# Patient Record
Sex: Male | Born: 1980 | Race: Black or African American | Hispanic: No | Smoking: Current every day smoker
Health system: Southern US, Community
[De-identification: ages and names within clinical notes are randomized; demographics above are authoritative.]

## PROBLEM LIST (undated history)

## (undated) ENCOUNTER — Emergency Department (HOSPITAL_COMMUNITY): Admission: EM | Payer: No Typology Code available for payment source | Source: Home / Self Care

## (undated) DIAGNOSIS — Z789 Other specified health status: Secondary | ICD-10-CM

## (undated) DIAGNOSIS — M1990 Unspecified osteoarthritis, unspecified site: Secondary | ICD-10-CM

## (undated) DIAGNOSIS — D573 Sickle-cell trait: Secondary | ICD-10-CM

## (undated) DIAGNOSIS — W3400XA Accidental discharge from unspecified firearms or gun, initial encounter: Secondary | ICD-10-CM

## (undated) HISTORY — PX: OTHER SURGICAL HISTORY: SHX169

## (undated) HISTORY — PX: FACIAL LACERATION REPAIR: SHX6589

---

## 2002-08-11 HISTORY — PX: KNEE SURGERY: SHX244

## 2002-08-19 ENCOUNTER — Inpatient Hospital Stay (HOSPITAL_COMMUNITY): Admission: EM | Admit: 2002-08-19 | Discharge: 2002-08-20 | Payer: Self-pay | Admitting: Emergency Medicine

## 2002-08-19 ENCOUNTER — Encounter: Payer: Self-pay | Admitting: Emergency Medicine

## 2005-08-01 ENCOUNTER — Emergency Department (HOSPITAL_COMMUNITY): Admission: EM | Admit: 2005-08-01 | Discharge: 2005-08-01 | Payer: Self-pay | Admitting: Emergency Medicine

## 2006-02-07 ENCOUNTER — Emergency Department (HOSPITAL_COMMUNITY): Admission: EM | Admit: 2006-02-07 | Discharge: 2006-02-07 | Payer: Self-pay | Admitting: Emergency Medicine

## 2006-03-07 ENCOUNTER — Emergency Department (HOSPITAL_COMMUNITY): Admission: EM | Admit: 2006-03-07 | Discharge: 2006-03-07 | Payer: Self-pay | Admitting: Emergency Medicine

## 2006-04-05 ENCOUNTER — Emergency Department (HOSPITAL_COMMUNITY): Admission: EM | Admit: 2006-04-05 | Discharge: 2006-04-05 | Payer: Self-pay | Admitting: Emergency Medicine

## 2006-04-11 ENCOUNTER — Emergency Department (HOSPITAL_COMMUNITY): Admission: EM | Admit: 2006-04-11 | Discharge: 2006-04-11 | Payer: Self-pay | Admitting: Emergency Medicine

## 2006-05-31 ENCOUNTER — Emergency Department (HOSPITAL_COMMUNITY): Admission: EM | Admit: 2006-05-31 | Discharge: 2006-05-31 | Payer: Self-pay | Admitting: Emergency Medicine

## 2006-09-02 ENCOUNTER — Emergency Department (HOSPITAL_COMMUNITY): Admission: EM | Admit: 2006-09-02 | Discharge: 2006-09-02 | Payer: Self-pay | Admitting: Family Medicine

## 2006-09-13 ENCOUNTER — Emergency Department (HOSPITAL_COMMUNITY): Admission: EM | Admit: 2006-09-13 | Discharge: 2006-09-13 | Payer: Self-pay | Admitting: Family Medicine

## 2006-09-19 ENCOUNTER — Emergency Department (HOSPITAL_COMMUNITY): Admission: EM | Admit: 2006-09-19 | Discharge: 2006-09-19 | Payer: Self-pay | Admitting: Family Medicine

## 2007-03-02 ENCOUNTER — Inpatient Hospital Stay (HOSPITAL_COMMUNITY): Admission: EM | Admit: 2007-03-02 | Discharge: 2007-03-03 | Payer: Self-pay | Admitting: Emergency Medicine

## 2007-06-01 ENCOUNTER — Ambulatory Visit: Payer: Self-pay | Admitting: Internal Medicine

## 2007-06-29 ENCOUNTER — Emergency Department (HOSPITAL_COMMUNITY): Admission: EM | Admit: 2007-06-29 | Discharge: 2007-06-29 | Payer: Self-pay | Admitting: Emergency Medicine

## 2008-07-12 ENCOUNTER — Emergency Department (HOSPITAL_COMMUNITY): Admission: EM | Admit: 2008-07-12 | Discharge: 2008-07-12 | Payer: Self-pay | Admitting: Family Medicine

## 2010-01-01 ENCOUNTER — Emergency Department (HOSPITAL_COMMUNITY): Admission: EM | Admit: 2010-01-01 | Discharge: 2010-01-01 | Payer: Self-pay | Admitting: Emergency Medicine

## 2010-01-07 ENCOUNTER — Emergency Department (HOSPITAL_COMMUNITY): Admission: EM | Admit: 2010-01-07 | Discharge: 2010-01-07 | Payer: Self-pay | Admitting: Family Medicine

## 2010-05-10 ENCOUNTER — Emergency Department (HOSPITAL_COMMUNITY): Admission: EM | Admit: 2010-05-10 | Discharge: 2010-05-10 | Payer: Self-pay | Admitting: Family Medicine

## 2010-08-20 ENCOUNTER — Emergency Department (HOSPITAL_COMMUNITY): Admission: EM | Admit: 2010-08-20 | Discharge: 2010-08-20 | Payer: Self-pay | Admitting: Family Medicine
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
## 2011-07-12 LAB — HIV ANTIBODY (ROUTINE TESTING W REFLEX): HIV: NONREACTIVE

## 2011-07-12 LAB — GC/CHLAMYDIA PROBE AMP, GENITAL
Chlamydia, DNA Probe: NEGATIVE
GC Probe Amp, Genital: NEGATIVE

## 2011-07-12 LAB — HERPES SIMPLEX VIRUS CULTURE: Culture: DETECTED

## 2011-07-12 LAB — HEPATITIS B SURFACE ANTIGEN: Hepatitis B Surface Ag: NEGATIVE

## 2011-07-22 LAB — POCT URINALYSIS DIP (DEVICE)
Bilirubin Urine: NEGATIVE
Glucose, UA: NEGATIVE
Hgb urine dipstick: NEGATIVE
Operator id: 235561
Specific Gravity, Urine: 1.015
Urobilinogen, UA: 1
pH: 6.5

## 2011-12-02 ENCOUNTER — Encounter (HOSPITAL_COMMUNITY): Payer: Self-pay

## 2011-12-02 ENCOUNTER — Emergency Department (HOSPITAL_COMMUNITY)
Admission: EM | Admit: 2011-12-02 | Discharge: 2011-12-02 | Disposition: A | Discharge status: Home Health | Payer: Self-pay | Attending: Emergency Medicine | Admitting: Emergency Medicine

## 2011-12-02 DIAGNOSIS — N390 Urinary tract infection, site not specified: Secondary | ICD-10-CM | POA: Insufficient documentation

## 2011-12-02 DIAGNOSIS — N509 Disorder of male genital organs, unspecified: Secondary | ICD-10-CM | POA: Insufficient documentation

## 2011-12-02 LAB — URINALYSIS, ROUTINE W REFLEX MICROSCOPIC
Glucose, UA: NEGATIVE mg/dL
Hgb urine dipstick: NEGATIVE
Protein, ur: NEGATIVE mg/dL

## 2011-12-02 MED ORDER — CEFTRIAXONE SODIUM 250 MG IJ SOLR
250.0000 mg | Freq: Once | INTRAMUSCULAR | Status: AC
  Administered 2011-12-02: 250 mg via INTRAMUSCULAR
  Filled 2011-12-02: qty 250

## 2011-12-02 MED ORDER — CIPROFLOXACIN HCL 500 MG PO TABS: 500.0000 mg | ORAL_TABLET | Freq: Two times a day (BID) | ORAL | Status: AC

## 2011-12-02 MED ORDER — AZITHROMYCIN 250 MG PO TABS
1000.0000 mg | ORAL_TABLET | Freq: Once | ORAL | Status: AC
  Administered 2011-12-02: 1000 mg via ORAL
  Filled 2011-12-02: qty 4

## 2011-12-02 NOTE — Discharge Instructions (Signed)

## 2011-12-02 NOTE — ED Provider Notes (Signed)
History     CSN: [id number]  Arrival date & time 12/02/11  1034   First MD Initiated Contact with Patient 12/02/11 1117      Chief Complaint  Patient presents with  . Penis Pain    (Consider location/radiation/quality/duration/timing/severity/associated sxs/prior treatment) HPI  31 year old healthy male presenting to the ED with chief complaints of penile pain. Pain started last night after the patient had sexual intercourse with his girlfriend. He did not use any protection. Patient state pain is noticed to the tip of his penis. Describe pain as a sharp sensation that waxed and waned. He denies any dysuria, urinary frequency, penile discharge, or rash. He denies abdominal pain, hernia, testicular pain, or swelling. 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 He denies any fever, chills, nausea, vomiting, diarrhea.  History reviewed. No pertinent past medical history.  History reviewed. No pertinent past surgical history.  History reviewed. No pertinent family history.  History  Substance Use Topics  . Smoking status: Current Everyday Smoker -- 0.5 packs/day  . Smokeless tobacco: Not on file  . Alcohol Use: Yes      Review of Systems  All other systems reviewed and are negative.    Allergies  Aspirin  Home Medications  No current outpatient prescriptions on file.  BP 128/68  Pulse 80  Temp(Src) 98.1 F (36.7 C) (Oral)  Resp 20  SpO2 98%  Physical Exam  Nursing note and vitals reviewed. Constitutional: He appears well-developed and well-nourished. No distress.  Eyes: Conjunctivae are normal.  Neck: Neck supple.  Abdominal: Soft. There is no tenderness. No hernia. Hernia confirmed negative in the ventral area, confirmed negative in the right inguinal area and confirmed negative in the left inguinal area.  Genitourinary: Testes normal and penis normal. Cremasteric reflex is present. Right testis shows no mass, no swelling and no tenderness. Right  testis is descended. Cremasteric reflex is not absent on the right side. Left testis shows no mass, no swelling and no tenderness. Left testis is descended. Cremasteric reflex is not absent on the left side. Circumcised. No phimosis, paraphimosis, hypospadias, penile erythema or penile tenderness. No discharge found.  Musculoskeletal: Normal range of motion.  Lymphadenopathy:       Right: No inguinal adenopathy present.       Left: No inguinal adenopathy present.  Neurological: He is alert.  Skin: Skin is warm and dry.  Psychiatric: He has a normal mood and affect.    ED Course  Procedures (including critical care time)  Labs Reviewed - No data to display No results found.   No diagnosis found.    MDM  Patient has pain to the tip of penis after sexual intercourse, unprotected. Examination is unremarkable. Pain is not reproducible. Shaft of penis is normal without pain. No testicular pain or swelling. No hernia noted. Testicle descended bilaterally. I have low suspicions for penile fracture, testicular torsion, epididymitis, or orchitis. A urine sample was obtained, and a GC/Chlamydia culture was also obtained. Patient will also be treated prophylactically with Zithromax and Rocephin. Safe sexual practice discussed. Strict followup instruction given. Patient voiced understanding and agree with plan.  12:01 PM Patient's urine shows evidence of urinary tract infection. Patient will be discharged with antibiotic. Urine culture sent.        Fayrene Helper, PA-C 12/02/11 1202

## 2011-12-02 NOTE — ED Notes (Signed)
Pt states that starting yesterday he developed penile pain that was sharp in nature. He is unsure about any drainage. Denies any scrotal pain.

## 2011-12-02 NOTE — ED Provider Notes (Signed)
Medical screening examination/treatment/procedure(s) were performed by non-physician practitioner and as supervising physician I was immediately available for consultation/collaboration.  Angelyne Terwilliger P Guinevere Stephenson, MD 12/02/11 1612 

## 2011-12-03 LAB — URINE CULTURE
Colony Count: NO GROWTH
Culture  Setup Time: 201302211249
Culture: NO GROWTH

## 2011-12-04 LAB — GC/CHLAMYDIA PROBE AMP, GENITAL: Chlamydia, DNA Probe: POSITIVE — AB

## 2011-12-05 NOTE — ED Notes (Signed)
+  Chlamydia. Patient treated with Rocephin and Zithromax. 

## 2011-12-05 NOTE — ED Notes (Signed)
Patient called back and was notified of + Chlamydia after verifying X 2.

## 2011-12-05 NOTE — ED Notes (Signed)
Attempted to call patient.  No answer.

## 2012-02-16 ENCOUNTER — Encounter (HOSPITAL_COMMUNITY): Payer: Self-pay | Admitting: *Deleted

## 2012-02-16 ENCOUNTER — Emergency Department (HOSPITAL_COMMUNITY): Payer: Self-pay

## 2012-02-16 ENCOUNTER — Emergency Department (HOSPITAL_COMMUNITY)
Admission: EM | Admit: 2012-02-16 | Discharge: 2012-02-16 | Disposition: A | Discharge status: Home / Self Care | Payer: Self-pay | Attending: Emergency Medicine | Admitting: Emergency Medicine

## 2012-02-16 DIAGNOSIS — S0083XA Contusion of other part of head, initial encounter: Secondary | ICD-10-CM

## 2012-02-16 DIAGNOSIS — S0003XA Contusion of scalp, initial encounter: Secondary | ICD-10-CM | POA: Insufficient documentation

## 2012-02-16 DIAGNOSIS — R220 Localized swelling, mass and lump, head: Secondary | ICD-10-CM | POA: Insufficient documentation

## 2012-02-16 MED ORDER — PROPARACAINE HCL 0.5 % OP SOLN
1.0000 [drp] | Freq: Once | OPHTHALMIC | Status: AC
  Administered 2012-02-16: 1 [drp] via OPHTHALMIC
  Filled 2012-02-16 (×2): qty 15
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
## 2012-11-22 ENCOUNTER — Emergency Department (HOSPITAL_COMMUNITY)
Admission: EM | Admit: 2012-11-22 | Discharge: 2012-11-22 | Disposition: A | Discharge status: Home / Self Care | Payer: Self-pay | Attending: Emergency Medicine | Admitting: Emergency Medicine

## 2012-11-22 ENCOUNTER — Emergency Department (HOSPITAL_COMMUNITY): Payer: Self-pay

## 2012-11-22 ENCOUNTER — Encounter (HOSPITAL_COMMUNITY): Payer: Self-pay | Admitting: *Deleted

## 2012-11-22 DIAGNOSIS — R05 Cough: Secondary | ICD-10-CM | POA: Insufficient documentation

## 2012-11-22 DIAGNOSIS — J45909 Unspecified asthma, uncomplicated: Secondary | ICD-10-CM | POA: Insufficient documentation

## 2012-11-22 DIAGNOSIS — J3489 Other specified disorders of nose and nasal sinuses: Secondary | ICD-10-CM | POA: Insufficient documentation

## 2012-11-22 DIAGNOSIS — J209 Acute bronchitis, unspecified: Secondary | ICD-10-CM | POA: Insufficient documentation

## 2012-11-22 DIAGNOSIS — R059 Cough, unspecified: Secondary | ICD-10-CM | POA: Insufficient documentation

## 2012-11-22 DIAGNOSIS — IMO0001 Reserved for inherently not codable concepts without codable children: Secondary | ICD-10-CM | POA: Insufficient documentation

## 2012-11-22 DIAGNOSIS — J40 Bronchitis, not specified as acute or chronic: Secondary | ICD-10-CM

## 2012-11-22 DIAGNOSIS — F17200 Nicotine dependence, unspecified, uncomplicated: Secondary | ICD-10-CM | POA: Insufficient documentation

## 2012-11-22 DIAGNOSIS — R51 Headache: Secondary | ICD-10-CM | POA: Insufficient documentation

## 2012-11-22 DIAGNOSIS — R0982 Postnasal drip: Secondary | ICD-10-CM | POA: Insufficient documentation

## 2012-11-22 MED ORDER — ALBUTEROL SULFATE HFA 108 (90 BASE) MCG/ACT IN AERS
2.0000 | INHALATION_SPRAY | Freq: Once | RESPIRATORY_TRACT | Status: AC
  Administered 2012-11-22: 2 via RESPIRATORY_TRACT
  Filled 2012-11-22: qty 6.7

## 2012-11-22 MED ORDER — AZITHROMYCIN 250 MG PO TABS: 250.0000 mg | ORAL_TABLET | Freq: Every day | ORAL | Status: DC

## 2012-11-22 MED ORDER — AZITHROMYCIN 250 MG PO TABS
500.0000 mg | ORAL_TABLET | Freq: Once | ORAL | Status: AC
  Administered 2012-11-22: 500 mg via ORAL
  Filled 2012-11-22: qty 2

## 2012-11-22 NOTE — ED Provider Notes (Signed)
History     CSN: [id number]  Arrival date & time 11/22/12  1113   First MD Initiated Contact with Patient 11/22/12 1137      Chief Complaint  Patient presents with  . Generalized Body Aches  . Fever    (Consider location/radiation/quality/duration/timing/severity/associated sxs/prior treatment) HPI Comments: 32 y.o. Male presents to the ER with a c/o fever, cough, headache, and body aches x4 days that have been gradually worsening. States cough is productive with green sputum on occasion. He also complains of a achy sore throat that is worsened by swallowing. Admits to associated headache. Fever is subjective since he has not taken his temperature. He try taking Tylenol which he does not feel provided any relief. He complains of an episode of night sweats last night.  Nothing has helped any of his symptoms and nothing makes them worse.  He has been around sick children.  Denies any medical problems and takes no medications on a regular basis. He did not have the flu shot this year.   The history is provided by the patient.    History reviewed. No pertinent past medical history.  History reviewed. No pertinent past surgical history.  No family history on file.  History  Substance Use Topics  . Smoking status: Current Every Day Smoker -- 0.25 packs/day  . Smokeless tobacco: Not on file  . Alcohol Use: Yes     Comment: 1 40 oz per day      Review of Systems  Constitutional: Positive for fever, chills and activity change.  HENT: Positive for congestion and postnasal drip. Negative for neck pain and neck stiffness.   Eyes: Negative.   Respiratory: Positive for cough. Negative for shortness of breath and wheezing.   Cardiovascular: Negative for chest pain.  Gastrointestinal: Negative.  Negative for nausea and vomiting.  Endocrine: Negative.   Genitourinary: Negative.   Musculoskeletal: Positive for myalgias and arthralgias.  Skin: Negative.   Allergic/Immunologic: Negative.    Neurological: Positive for headaches. Negative for syncope.  Hematological: Negative.   Psychiatric/Behavioral: Negative.   All other systems reviewed and are negative.    Allergies  Aspirin  Home Medications  No current outpatient prescriptions on file.  BP 112/76  Pulse 105  Temp(Src) 100.7 F (38.2 C) (Oral)  Resp 18  SpO2 96%  Physical Exam  Nursing note and vitals reviewed. Constitutional: He is oriented to person, place, and time. He appears well-developed and well-nourished. No distress.  HENT:  Head: Normocephalic and atraumatic.  Nose: Mucosal edema present.  Mouth/Throat: No oropharyngeal exudate.  Oropharynx is erythematous and slightly swollen but without exudate.    Eyes: Conjunctivae and EOM are normal. Pupils are equal, round, and reactive to light. Right eye exhibits no discharge. Left eye exhibits no discharge. No scleral icterus.  Neck: Normal range of motion. Neck supple. No tracheal deviation present.  Cardiovascular: Regular rhythm and normal heart sounds.   Slightly tachycardic on exam  Pulmonary/Chest: Effort normal. No stridor. He has wheezes. He has rhonchi.  Expiratory wheezes and rhonchi noted in right mid to upper lung fields, cleared with cough.  Abdominal: Soft. Bowel sounds are normal.  Musculoskeletal: Normal range of motion. He exhibits no edema and no tenderness.  Lymphadenopathy:    He has no cervical adenopathy.  Neurological: He is alert and oriented to person, place, and time.  Skin: Skin is warm and dry. He is not diaphoretic.  Psychiatric: He has a normal mood and affect. His behavior is normal.  ED Course  Procedures (including critical care time)  Labs Reviewed - No data to display Dg Chest 2 View  11/22/2012  *RADIOLOGY REPORT*  Clinical Data: Fever, body aches, smoker  CHEST - 2 VIEW  Comparison:  None.  Findings:  The heart size and mediastinal contours are within normal limits.  Both lungs are clear.  The visualized  skeletal structures are unremarkable.  IMPRESSION: No active cardiopulmonary disease.   Original Report Authenticated By: Judie Petit. Shick, M.D.      1. Bronchitis   2. Reactive airway disease       MDM  32 year old male with bronchitis and bronchospasm. X-ray without any acute abnormality. Rhonchi cleared with cough. I will place him on Zithromax and given albuterol inhaler. Conservative measures discussed. Return precautions discussed. Patient states understanding of plan and is agreeable.        Trevor Mace, PA-C 11/22/12 1338

## 2012-11-22 NOTE — ED Notes (Signed)
Pt with upper respiratory congestion, headache, body aches and fevers since Sunday.

## 2012-11-23 NOTE — ED Provider Notes (Signed)
Medical screening examination/treatment/procedure(s) were performed by non-physician practitioner and as supervising physician I was immediately available for consultation/collaboration.   Joya Gaskins, MD 11/23/12 [phone]

## 2014-04-10 HISTORY — PX: TOE AMPUTATION: SHX809

## 2014-05-05 ENCOUNTER — Emergency Department (HOSPITAL_COMMUNITY)
Admission: EM | Admit: 2014-05-05 | Discharge: 2014-05-05 | Disposition: A | Discharge status: Home / Self Care | Payer: Self-pay

## 2014-05-05 ENCOUNTER — Encounter (HOSPITAL_COMMUNITY): Payer: Self-pay | Admitting: Emergency Medicine

## 2014-05-05 ENCOUNTER — Emergency Department (HOSPITAL_COMMUNITY)
Admission: EM | Admit: 2014-05-05 | Discharge: 2014-05-05 | Disposition: A | Discharge status: Home / Self Care | Payer: Self-pay | Attending: Emergency Medicine | Admitting: Emergency Medicine

## 2014-05-05 ENCOUNTER — Other Ambulatory Visit (HOSPITAL_COMMUNITY): Payer: Self-pay

## 2014-05-05 ENCOUNTER — Emergency Department (HOSPITAL_COMMUNITY): Payer: Self-pay

## 2014-05-05 DIAGNOSIS — Z23 Encounter for immunization: Secondary | ICD-10-CM | POA: Insufficient documentation

## 2014-05-05 DIAGNOSIS — S99919A Unspecified injury of unspecified ankle, initial encounter: Secondary | ICD-10-CM

## 2014-05-05 DIAGNOSIS — S99921A Unspecified injury of right foot, initial encounter: Secondary | ICD-10-CM

## 2014-05-05 DIAGNOSIS — S99921S Unspecified injury of right foot, sequela: Secondary | ICD-10-CM

## 2014-05-05 DIAGNOSIS — IMO0002 Reserved for concepts with insufficient information to code with codable children: Secondary | ICD-10-CM | POA: Insufficient documentation

## 2014-05-05 DIAGNOSIS — S199XXA Unspecified injury of neck, initial encounter: Secondary | ICD-10-CM

## 2014-05-05 DIAGNOSIS — S99929A Unspecified injury of unspecified foot, initial encounter: Secondary | ICD-10-CM

## 2014-05-05 DIAGNOSIS — F17200 Nicotine dependence, unspecified, uncomplicated: Secondary | ICD-10-CM | POA: Insufficient documentation

## 2014-05-05 DIAGNOSIS — S0993XA Unspecified injury of face, initial encounter: Secondary | ICD-10-CM | POA: Insufficient documentation

## 2014-05-05 DIAGNOSIS — S40019A Contusion of unspecified shoulder, initial encounter: Secondary | ICD-10-CM | POA: Insufficient documentation

## 2014-05-05 DIAGNOSIS — S91109A Unspecified open wound of unspecified toe(s) without damage to nail, initial encounter: Secondary | ICD-10-CM | POA: Insufficient documentation

## 2014-05-05 DIAGNOSIS — S8990XA Unspecified injury of unspecified lower leg, initial encounter: Secondary | ICD-10-CM | POA: Insufficient documentation

## 2014-05-05 MED ORDER — CEPHALEXIN 500 MG PO CAPS: 500.0000 mg | ORAL_CAPSULE | Freq: Four times a day (QID) | ORAL | Status: DC

## 2014-05-05 MED ORDER — OXYCODONE-ACETAMINOPHEN 7.5-325 MG PO TABS: 1.0000 | ORAL_TABLET | ORAL | Status: DC | PRN

## 2014-05-05 MED ORDER — TETANUS-DIPHTH-ACELL PERTUSSIS 5-2.5-18.5 LF-MCG/0.5 IM SUSP
0.5000 mL | Freq: Once | INTRAMUSCULAR | Status: AC
  Administered 2014-05-05: 0.5 mL via INTRAMUSCULAR
  Filled 2014-05-05: qty 0.5

## 2014-05-05 MED ORDER — HYDROCODONE-ACETAMINOPHEN 5-325 MG PO TABS
2.0000 | ORAL_TABLET | Freq: Once | ORAL | Status: AC
  Administered 2014-05-05: 2 via ORAL
  Filled 2014-05-05: qty 2

## 2014-05-05 MED ORDER — DEXTROSE 5 % IV SOLN
1.0000 g | Freq: Once | INTRAVENOUS | Status: AC
  Administered 2014-05-05: 1 g via INTRAVENOUS
  Filled 2014-05-05: qty 10

## 2014-05-05 NOTE — ED Notes (Signed)
Ortho MD at bedside.

## 2014-05-05 NOTE — ED Notes (Signed)
Soaking right foot in betadine and NS per MD.
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
## 2014-05-05 NOTE — Discharge Instructions (Signed)
1. Weight bearing as tolerated in the postop shoe 2. Our office will call you to set up surgery on Wednesday 3. Pick up your antibiotics from your pharmacy and take them as directed 4. Keep dressings and do not change them.
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
## 2014-05-05 NOTE — ED Notes (Signed)
D/c'd cardiac monitoring per MD order.

## 2014-05-07 ENCOUNTER — Encounter (HOSPITAL_COMMUNITY): Payer: Self-pay

## 2014-05-07 ENCOUNTER — Other Ambulatory Visit (HOSPITAL_COMMUNITY): Payer: Self-pay | Admitting: Orthopaedic Surgery

## 2014-05-07 ENCOUNTER — Ambulatory Visit (HOSPITAL_COMMUNITY)
Admission: RE | Admit: 2014-05-07 | Discharge: 2014-05-07 | Disposition: A | Discharge status: Home / Self Care | Payer: Self-pay | Source: Ambulatory Visit | Attending: Orthopaedic Surgery | Admitting: Orthopaedic Surgery

## 2014-05-07 DIAGNOSIS — S99919A Unspecified injury of unspecified ankle, initial encounter: Secondary | ICD-10-CM

## 2014-05-07 DIAGNOSIS — X58XXXA Exposure to other specified factors, initial encounter: Secondary | ICD-10-CM | POA: Insufficient documentation

## 2014-05-07 DIAGNOSIS — S99929A Unspecified injury of unspecified foot, initial encounter: Secondary | ICD-10-CM

## 2014-05-07 DIAGNOSIS — Z01818 Encounter for other preprocedural examination: Secondary | ICD-10-CM | POA: Insufficient documentation

## 2014-05-07 DIAGNOSIS — S8990XA Unspecified injury of unspecified lower leg, initial encounter: Secondary | ICD-10-CM | POA: Insufficient documentation

## 2014-05-07 HISTORY — DX: Other specified health status: Z78.9

## 2014-05-07 LAB — CBC
HCT: 43.1 % (ref 39.0–52.0)
Hemoglobin: 14.6 g/dL (ref 13.0–17.0)
MCH: 28.7 pg (ref 26.0–34.0)
MCHC: 33.9 g/dL (ref 30.0–36.0)
MCV: 84.8 fL (ref 78.0–100.0)
Platelets: 202 10*3/uL (ref 150–400)
RBC: 5.08 MIL/uL (ref 4.22–5.81)
RDW: 14.4 % (ref 11.5–15.5)
WBC: 8.8 10*3/uL (ref 4.0–10.5)

## 2014-05-07 MED ORDER — CEFAZOLIN SODIUM-DEXTROSE 2-3 GM-% IV SOLR
2.0000 g | INTRAVENOUS | Status: AC
  Administered 2014-05-08: 2 g via INTRAVENOUS
  Filled 2014-05-07: qty 50

## 2014-05-07 NOTE — Pre-Procedure Instructions (Signed)
Clayton ChuteDan Albright  05/07/2014   Your procedure is scheduled on:  Wednesday, July 29.  [facility] at 6:30 AM.  Call this number if you have problems the morning of surgery: [phone]   Remember:   Do not eat food or drink liquids after midnight.   Take these medicines the morning of surgery with A SIP OF WATER: cephALEXin (KEFLEX).  May take  oxyCODONE-acetaminophen (PERCOCET).    Do not wear jewelry, make-up or nail polish.  Do not wear lotions, powders, or perfumes.              Men may shave face and neck.  Do not bring valuables to the hospital.             [facility] is not responsible  for any belongings or valuables.               Contacts, dentures or bridgework may not be worn into surgery.  Leave suitcase in the car. After surgery it may be brought to your room.  For patients admitted to the hospital, discharge time is determined by your treatment team.               Patients discharged the day of surgery will not be allowed to drive home.  Name and phone number of your driver: -   Special Instructions: Review  [facility] - Preparing For Surgery.   Please read over the following fact sheets that you were given: Pain Booklet, Coughing and Deep Breathing and Surgical Site Infection Prevention

## 2014-05-08 ENCOUNTER — Encounter (HOSPITAL_COMMUNITY): Payer: Self-pay | Admitting: Anesthesiology

## 2014-05-08 ENCOUNTER — Encounter (HOSPITAL_COMMUNITY): Payer: Self-pay | Admitting: Surgery

## 2014-05-08 ENCOUNTER — Ambulatory Visit (HOSPITAL_COMMUNITY): Payer: Self-pay | Admitting: Anesthesiology

## 2014-05-08 ENCOUNTER — Ambulatory Visit (HOSPITAL_COMMUNITY)
Admission: RE | Admit: 2014-05-08 | Discharge: 2014-05-08 | Disposition: A | Discharge status: Home / Self Care | Payer: Self-pay | Source: Ambulatory Visit | Attending: Orthopaedic Surgery | Admitting: Orthopaedic Surgery

## 2014-05-08 ENCOUNTER — Encounter (HOSPITAL_COMMUNITY)
Admission: RE | Disposition: A | Discharge status: Home / Self Care | Payer: Self-pay | Source: Ambulatory Visit | Attending: Orthopaedic Surgery

## 2014-05-08 DIAGNOSIS — Z79899 Other long term (current) drug therapy: Secondary | ICD-10-CM | POA: Insufficient documentation

## 2014-05-08 DIAGNOSIS — S99921S Unspecified injury of right foot, sequela: Secondary | ICD-10-CM

## 2014-05-08 DIAGNOSIS — S99929A Unspecified injury of unspecified foot, initial encounter: Principal | ICD-10-CM

## 2014-05-08 DIAGNOSIS — F17200 Nicotine dependence, unspecified, uncomplicated: Secondary | ICD-10-CM | POA: Insufficient documentation

## 2014-05-08 DIAGNOSIS — S99919A Unspecified injury of unspecified ankle, initial encounter: Principal | ICD-10-CM

## 2014-05-08 DIAGNOSIS — S8990XA Unspecified injury of unspecified lower leg, initial encounter: Secondary | ICD-10-CM | POA: Insufficient documentation

## 2014-05-08 HISTORY — PX: AMPUTATION: SHX166

## 2014-05-08 SURGERY — AMPUTATION DIGIT
Anesthesia: Regional | Site: Toe | Laterality: Right

## 2014-05-08 MED ORDER — ARTIFICIAL TEARS OP OINT
TOPICAL_OINTMENT | OPHTHALMIC | Status: DC | PRN
  Administered 2014-05-08: 1 via OPHTHALMIC

## 2014-05-08 MED ORDER — BUPIVACAINE HCL (PF) 0.25 % IJ SOLN
INTRAMUSCULAR | Status: AC
  Filled 2014-05-08: qty 30

## 2014-05-08 MED ORDER — LIDOCAINE HCL (CARDIAC) 20 MG/ML IV SOLN
INTRAVENOUS | Status: DC | PRN
  Administered 2014-05-08: 80 mg via INTRAVENOUS

## 2014-05-08 MED ORDER — ARTIFICIAL TEARS OP OINT
TOPICAL_OINTMENT | OPHTHALMIC | Status: AC
  Filled 2014-05-08: qty 3.5

## 2014-05-08 MED ORDER — LACTATED RINGERS IV SOLN
INTRAVENOUS | Status: DC | PRN
  Administered 2014-05-08: 08:00:00 via INTRAVENOUS

## 2014-05-08 MED ORDER — ONDANSETRON HCL 4 MG/2ML IJ SOLN
INTRAMUSCULAR | Status: DC | PRN
  Administered 2014-05-08: 4 mg via INTRAVENOUS

## 2014-05-08 MED ORDER — PROPOFOL 10 MG/ML IV BOLUS
INTRAVENOUS | Status: DC | PRN
  Administered 2014-05-08: 170 mg via INTRAVENOUS

## 2014-05-08 MED ORDER — HYDROCODONE-ACETAMINOPHEN 5-325 MG PO TABS: 1.0000 | ORAL_TABLET | ORAL | Status: DC | PRN

## 2014-05-08 MED ORDER — MIDAZOLAM HCL 5 MG/5ML IJ SOLN
INTRAMUSCULAR | Status: DC | PRN
  Administered 2014-05-08: 2 mg via INTRAVENOUS

## 2014-05-08 MED ORDER — FENTANYL CITRATE 0.05 MG/ML IJ SOLN
INTRAMUSCULAR | Status: AC
  Filled 2014-05-08: qty 5

## 2014-05-08 MED ORDER — FENTANYL CITRATE 0.05 MG/ML IJ SOLN
INTRAMUSCULAR | Status: DC | PRN
  Administered 2014-05-08 (×2): 50 ug via INTRAVENOUS

## 2014-05-08 MED ORDER — HYDROMORPHONE HCL PF 1 MG/ML IJ SOLN
INTRAMUSCULAR | Status: AC
  Filled 2014-05-08: qty 1

## 2014-05-08 MED ORDER — BACITRACIN ZINC 500 UNIT/GM EX OINT
TOPICAL_OINTMENT | CUTANEOUS | Status: AC
  Filled 2014-05-08: qty 15

## 2014-05-08 MED ORDER — ONDANSETRON HCL 4 MG/2ML IJ SOLN
INTRAMUSCULAR | Status: AC
  Filled 2014-05-08: qty 2

## 2014-05-08 MED ORDER — ROPIVACAINE HCL 5 MG/ML IJ SOLN
INTRAMUSCULAR | Status: DC | PRN
  Administered 2014-05-08: 30 mL via PERINEURAL

## 2014-05-08 MED ORDER — OXYCODONE HCL 5 MG PO TABS: 5.0000 mg | ORAL_TABLET | ORAL | Status: DC | PRN

## 2014-05-08 MED ORDER — MIDAZOLAM HCL 2 MG/2ML IJ SOLN
INTRAMUSCULAR | Status: AC
  Filled 2014-05-08: qty 2

## 2014-05-08 MED ORDER — LIDOCAINE HCL (CARDIAC) 20 MG/ML IV SOLN
INTRAVENOUS | Status: AC
  Filled 2014-05-08: qty 5

## 2014-05-08 MED ORDER — FENTANYL CITRATE 0.05 MG/ML IJ SOLN: 25.0000 ug | INTRAMUSCULAR | Status: DC | PRN

## 2014-05-08 MED ORDER — SENNOSIDES-DOCUSATE SODIUM 8.6-50 MG PO TABS: 1.0000 | ORAL_TABLET | Freq: Every evening | ORAL | Status: DC | PRN

## 2014-05-08 MED ORDER — PROPOFOL 10 MG/ML IV BOLUS
INTRAVENOUS | Status: AC
  Filled 2014-05-08: qty 20

## 2014-05-08 MED ORDER — BACITRACIN ZINC 500 UNIT/GM EX OINT
TOPICAL_OINTMENT | CUTANEOUS | Status: DC | PRN
  Administered 2014-05-08: 1 via TOPICAL

## 2014-05-08 MED ORDER — ONDANSETRON HCL 4 MG/2ML IJ SOLN: 4.0000 mg | Freq: Once | INTRAMUSCULAR | Status: DC | PRN

## 2014-05-08 SURGICAL SUPPLY — 48 items
BANDAGE COBAN STERILE 2 (GAUZE/BANDAGES/DRESSINGS) ×2 IMPLANT
BLADE AVERAGE 25MMX9MM (BLADE)
BLADE AVERAGE 25X9 (BLADE) IMPLANT
BLADE LONG MED 31MMX9MM (MISCELLANEOUS) ×1
BLADE LONG MED 31X9 (MISCELLANEOUS) ×2 IMPLANT
BNDG CMPR 9X4 STRL LF SNTH (GAUZE/BANDAGES/DRESSINGS) ×1
BNDG COHESIVE 3X5 TAN STRL LF (GAUZE/BANDAGES/DRESSINGS) ×2 IMPLANT
BNDG COHESIVE 4X5 TAN STRL (GAUZE/BANDAGES/DRESSINGS) ×2 IMPLANT
BNDG CONFORM 2 STRL LF (GAUZE/BANDAGES/DRESSINGS) ×3 IMPLANT
BNDG CONFORM 3 STRL LF (GAUZE/BANDAGES/DRESSINGS) IMPLANT
BNDG ESMARK 4X9 LF (GAUZE/BANDAGES/DRESSINGS) ×3 IMPLANT
CANISTER SUCTION 2500CC (MISCELLANEOUS) ×1 IMPLANT
CORDS BIPOLAR (ELECTRODE) IMPLANT
COVER SURGICAL LIGHT HANDLE (MISCELLANEOUS) ×3 IMPLANT
CUFF TOURNIQUET SINGLE 18IN (TOURNIQUET CUFF) IMPLANT
CUFF TOURNIQUET SINGLE 24IN (TOURNIQUET CUFF) IMPLANT
CUFF TOURNIQUET SINGLE 34IN LL (TOURNIQUET CUFF) IMPLANT
DRAPE SURG 17X23 STRL (DRAPES) IMPLANT
DRAPE U-SHAPE 47X51 STRL (DRAPES) IMPLANT
DRSG ADAPTIC 3X8 NADH LF (GAUZE/BANDAGES/DRESSINGS) ×2 IMPLANT
DURAPREP 26ML APPLICATOR (WOUND CARE) ×3 IMPLANT
ELECT CAUTERY BLADE 6.4 (BLADE) ×3 IMPLANT
ELECT REM PT RETURN 9FT ADLT (ELECTROSURGICAL) ×3
ELECTRODE REM PT RTRN 9FT ADLT (ELECTROSURGICAL) ×1 IMPLANT
FACESHIELD WRAPAROUND (MASK) ×3 IMPLANT
FACESHIELD WRAPAROUND OR TEAM (MASK) ×1 IMPLANT
GAUZE XEROFORM 1X8 LF (GAUZE/BANDAGES/DRESSINGS) IMPLANT
GLOVE SURG SS PI 7.5 STRL IVOR (GLOVE) ×3 IMPLANT
GOWN STRL REIN XL XLG (GOWN DISPOSABLE) ×3 IMPLANT
GOWN STRL REUS W/ TWL LRG LVL3 (GOWN DISPOSABLE) ×1 IMPLANT
GOWN STRL REUS W/TWL LRG LVL3 (GOWN DISPOSABLE) ×3
KIT BASIN OR (CUSTOM PROCEDURE TRAY) ×3 IMPLANT
KIT ROOM TURNOVER OR (KITS) ×3 IMPLANT
NDL HYPO 25GX1X1/2 BEV (NEEDLE) IMPLANT
NEEDLE HYPO 25GX1X1/2 BEV (NEEDLE) ×3 IMPLANT
NS IRRIG 1000ML POUR BTL (IV SOLUTION) ×3 IMPLANT
PACK ORTHO EXTREMITY (CUSTOM PROCEDURE TRAY) ×3 IMPLANT
PAD ARMBOARD 7.5X6 YLW CONV (MISCELLANEOUS) ×6 IMPLANT
SPECIMEN JAR SMALL (MISCELLANEOUS) ×3 IMPLANT
SPONGE GAUZE 4X4 12PLY (GAUZE/BANDAGES/DRESSINGS) IMPLANT
SPONGE GAUZE 4X4 12PLY STER LF (GAUZE/BANDAGES/DRESSINGS) ×2 IMPLANT
SUT ETHILON 2 0 FS 18 (SUTURE) IMPLANT
SYR CONTROL 10ML LL (SYRINGE) IMPLANT
TOWEL OR 17X24 6PK STRL BLUE (TOWEL DISPOSABLE) ×3 IMPLANT
TOWEL OR 17X26 10 PK STRL BLUE (TOWEL DISPOSABLE) ×3 IMPLANT
TUBE CONNECTING 12'X1/4 (SUCTIONS)
TUBE CONNECTING 12X1/4 (SUCTIONS) IMPLANT
WATER STERILE IRR 1000ML POUR (IV SOLUTION) ×3 IMPLANT

## 2014-05-08 NOTE — Transfer of Care (Signed)
Immediate Anesthesia Transfer of Care Note  Patient: Clayton Burton  Procedure(s) Performed: Procedure(s): Right 2nd toe partial amputation vs. complete amputation (Right)  Patient Location: PACU  Anesthesia Type:General  Level of Consciousness: awake, alert , oriented and sedated  Airway & Oxygen Therapy: Patient Spontanous Breathing and Patient connected to nasal cannula oxygen  Post-op Assessment: Report given to PACU RN, Post -op Vital signs reviewed and stable and Patient moving all extremities  Post vital signs: Reviewed and stable  Complications: No apparent anesthesia complications

## 2014-05-08 NOTE — Anesthesia Postprocedure Evaluation (Signed)
  Anesthesia Post-op Note  Patient: Clayton Burton  Procedure(s) Performed: Procedure(s): Right 2nd toe partial amputation vs. complete amputation (Right)  Patient Location: PACU  Anesthesia Type:General and Regional  Level of Consciousness: awake  Airway and Oxygen Therapy: Patient Spontanous Breathing  Post-op Pain: none  Post-op Assessment: Post-op Vital signs reviewed, Patient's Cardiovascular Status Stable, Respiratory Function Stable, Patent Airway and No signs of Nausea or vomiting  Post-op Vital Signs: Reviewed and stable  Last Vitals:  Filed Vitals:   05/08/14 1006  BP: 142/68  Pulse: 65  Temp:   Resp: 16    Complications: No apparent anesthesia complications

## 2014-05-08 NOTE — Progress Notes (Signed)
Orthopedic Tech Progress Note Patient Details:  Clayton Burton 19-Apr-1981 [id number] Delivered to OR desk Ortho Devices Type of Ortho Device: Postop shoe/boot Ortho Device/Splint Interventions: Ordered   Asia R Thompson 05/08/2014, 10:00 AM

## 2014-05-08 NOTE — Discharge Instructions (Signed)
1. Need to wear postop shoe when walking on the foot. 2. Finish the antibiotics 3. Change the dressing on Friday 7/31 and place dry gauze dressing on it and change as needed.
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
## 2014-05-08 NOTE — Anesthesia Procedure Notes (Addendum)
Procedure Name: LMA Insertion Date/Time: 05/08/2014 8:52 AM Performed by: Fransisca KaufmannMEYER, MARY E Pre-anesthesia Checklist: Patient identified, Emergency Drugs available, Suction available, Patient being monitored and Timeout performed Patient Re-evaluated:Patient Re-evaluated prior to inductionOxygen Delivery Method: Circle system utilized Preoxygenation: Pre-oxygenation with 100% oxygen Intubation Type: IV induction Ventilation: Mask ventilation without difficulty LMA: LMA inserted LMA Size: 4.0 Number of attempts: 1 Placement Confirmation: positive ETCO2 and breath sounds checked- equal and bilateral Tube secured with: Tape Dental Injury: Teeth and Oropharynx as per pre-operative assessment    Anesthesia Regional Block:  Popliteal block  Pre-Anesthetic Checklist: ,, timeout performed, Correct Patient, Correct Site, Correct Laterality, Correct Procedure, Correct Position, site marked, Risks and benefits discussed,  Surgical consent,  Pre-op evaluation,  At surgeon's request and post-op pain management  Laterality: Lower and Right  Prep: chloraprep       Needles:  Injection technique: Single-shot  Needle Type: Echogenic Stimulator Needle          Additional Needles:  Procedures: ultrasound guided (picture in chart) Popliteal block  Nerve Stimulator or Paresthesia:  Response: plantarflexion, 0.4 mA,   Additional Responses:   Narrative:  Start time: 05/08/2014 8:28 AM End time: 05/08/2014 8:36 AM Injection made incrementally with aspirations every 5 mL.  Performed by: Personally  Anesthesiologist: Jag Lenz  Additional Notes: H+P and labs reviewed, risks and benefits discussed with patient, procedure tolerated well without complications
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
## 2014-05-08 NOTE — Anesthesia Preprocedure Evaluation (Addendum)
Anesthesia Evaluation  Patient identified by MRN, date of birth, ID band Patient awake    Reviewed: Allergy & Precautions, H&P , NPO status , Patient's Chart, lab work & pertinent test results  History of Anesthesia Complications Negative for: history of anesthetic complications  Airway Mallampati: II TM Distance: >3 FB Neck ROM: Full    Dental  (+) Teeth Intact,    Pulmonary neg sleep apnea, neg COPDneg recent URI, Current Smoker,  breath sounds clear to auscultation        Cardiovascular negative cardio ROS  Rhythm:Regular     Neuro/Psych negative neurological ROS  negative psych ROS   GI/Hepatic negative GI ROS, Neg liver ROS,   Endo/Other  negative endocrine ROS  Renal/GU negative Renal ROS     Musculoskeletal   Abdominal   Peds  Hematology negative hematology ROS (+)   Anesthesia Other Findings   Reproductive/Obstetrics                          Anesthesia Physical Anesthesia Plan  ASA: II  Anesthesia Plan: General and Regional   Post-op Pain Management:    Induction: Intravenous  Airway Management Planned: LMA  Additional Equipment: None  Intra-op Plan:   Post-operative Plan: Extubation in OR  Informed Consent: I have reviewed the patients History and Physical, chart, labs and discussed the procedure including the risks, benefits and alternatives for the proposed anesthesia with the patient or authorized representative who has indicated his/her understanding and acceptance.   Dental advisory given  Plan Discussed with: CRNA and Surgeon  Anesthesia Plan Comments:         Anesthesia Quick Evaluation

## 2014-05-09 ENCOUNTER — Encounter (HOSPITAL_COMMUNITY): Payer: Self-pay | Admitting: Orthopaedic Surgery

## 2014-05-11 NOTE — Addendum Note (Signed)
Addendum created 05/11/14 1409 by Fransisca KaufmannMary E Shyloh Derosa, CRNA   Modules edited: Anesthesia Events

## 2014-06-21 ENCOUNTER — Encounter (HOSPITAL_COMMUNITY): Payer: Self-pay | Admitting: Emergency Medicine

## 2014-06-21 ENCOUNTER — Emergency Department (HOSPITAL_COMMUNITY): Payer: Self-pay

## 2014-06-21 ENCOUNTER — Emergency Department (HOSPITAL_COMMUNITY)
Admission: EM | Admit: 2014-06-21 | Discharge: 2014-06-21 | Discharge status: Against Medical Advice | Payer: Self-pay | Attending: Emergency Medicine | Admitting: Emergency Medicine

## 2014-06-21 DIAGNOSIS — Z789 Other specified health status: Secondary | ICD-10-CM | POA: Insufficient documentation

## 2014-06-21 DIAGNOSIS — F17200 Nicotine dependence, unspecified, uncomplicated: Secondary | ICD-10-CM | POA: Insufficient documentation

## 2014-06-21 DIAGNOSIS — M79671 Pain in right foot: Secondary | ICD-10-CM

## 2014-06-21 DIAGNOSIS — S98139A Complete traumatic amputation of one unspecified lesser toe, initial encounter: Secondary | ICD-10-CM | POA: Insufficient documentation

## 2014-06-21 DIAGNOSIS — M79609 Pain in unspecified limb: Secondary | ICD-10-CM | POA: Insufficient documentation

## 2014-06-21 DIAGNOSIS — Z87828 Personal history of other (healed) physical injury and trauma: Secondary | ICD-10-CM | POA: Insufficient documentation

## 2014-06-21 MED ORDER — HYDROCODONE-ACETAMINOPHEN 5-325 MG PO TABS
1.0000 | ORAL_TABLET | Freq: Once | ORAL | Status: AC
  Administered 2014-06-21: 1 via ORAL
  Filled 2014-06-21: qty 1
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
## 2014-06-21 NOTE — ED Provider Notes (Signed)
Medical screening examination/treatment/procedure(s) were performed by non-physician practitioner and as supervising physician I was immediately available for consultation/collaboration.   EKG Interpretation None      Clayvon Parlett, MD, FACEP   Jaquasia Doscher L Danne Vasek, MD 06/21/14 1546 
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
## 2014-07-02 NOTE — Discharge Planning (Signed)
[facility] Community Liaison was not able to see patient, [facility] orange card information will be sent to the address provided. °

## 2015-12-08 ENCOUNTER — Emergency Department (HOSPITAL_COMMUNITY)
Admission: EM | Admit: 2015-12-08 | Discharge: 2015-12-08 | Disposition: A | Discharge status: Home / Self Care | Payer: Self-pay | Attending: Emergency Medicine | Admitting: Emergency Medicine

## 2015-12-08 ENCOUNTER — Encounter (HOSPITAL_COMMUNITY): Payer: Self-pay | Admitting: *Deleted

## 2015-12-08 ENCOUNTER — Emergency Department (HOSPITAL_COMMUNITY): Payer: Self-pay

## 2015-12-08 DIAGNOSIS — M79631 Pain in right forearm: Secondary | ICD-10-CM | POA: Insufficient documentation

## 2015-12-08 DIAGNOSIS — M25511 Pain in right shoulder: Secondary | ICD-10-CM | POA: Insufficient documentation

## 2015-12-08 DIAGNOSIS — F17200 Nicotine dependence, unspecified, uncomplicated: Secondary | ICD-10-CM | POA: Insufficient documentation

## 2015-12-08 DIAGNOSIS — M79641 Pain in right hand: Secondary | ICD-10-CM | POA: Insufficient documentation

## 2015-12-08 DIAGNOSIS — R202 Paresthesia of skin: Secondary | ICD-10-CM | POA: Insufficient documentation

## 2015-12-08 DIAGNOSIS — M25531 Pain in right wrist: Secondary | ICD-10-CM | POA: Insufficient documentation

## 2015-12-08 MED ORDER — ACETAMINOPHEN 325 MG PO TABS
650.0000 mg | ORAL_TABLET | Freq: Once | ORAL | Status: DC
  Filled 2015-12-08: qty 2

## 2015-12-08 NOTE — ED Notes (Signed)
Patient transported to X-ray 
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
## 2015-12-08 NOTE — Discharge Instructions (Signed)
1. Medications: tylenol or motrin for pain, usual home medications 2. Treatment: rest, drink plenty of fluids, use sling and brace for comfort, rest, ice, elevate 3. Follow Up: please followup with your primary doctor for discussion of your diagnoses and further evaluation after today's visit; if you do not have a primary care doctor use the resource guide provided to find one; please return to the ER for increased pain, numbness, new or worsening symptoms   Musculoskeletal Pain Musculoskeletal pain is muscle and boney aches and pains. These pains can occur in any part of the body. Your caregiver may treat you without knowing the cause of the pain. They may treat you if blood or urine tests, X-rays, and other tests were normal.  CAUSES There is often not a definite cause or reason for these pains. These pains may be caused by a type of germ (virus). The discomfort may also come from overuse. Overuse includes working out too hard when your body is not fit. Boney aches also come from weather changes. Bone is sensitive to atmospheric pressure changes. HOME CARE INSTRUCTIONS   Ask when your test results will be ready. Make sure you get your test results.  Only take over-the-counter or prescription medicines for pain, discomfort, or fever as directed by your caregiver. If you were given medications for your condition, do not drive, operate machinery or power tools, or sign legal documents for 24 hours. Do not drink alcohol. Do not take sleeping pills or other medications that may interfere with treatment.  Continue all activities unless the activities cause more pain. When the pain lessens, slowly resume normal activities. Gradually increase the intensity and duration of the activities or exercise.  During periods of severe pain, bed rest may be helpful. Lay or sit in any position that is comfortable.  Putting ice on the injured area.  Put ice in a bag.  Place a towel between your skin and the  bag.  Leave the ice on for 15 to 20 minutes, 3 to 4 times a day.  Follow up with your caregiver for continued problems and no reason can be found for the pain. If the pain becomes worse or does not go away, it may be necessary to repeat tests or do additional testing. Your caregiver may need to look further for a possible cause. SEEK IMMEDIATE MEDICAL CARE IF:  You have pain that is getting worse and is not relieved by medications.  You develop chest pain that is associated with shortness or breath, sweating, feeling sick to your stomach (nauseous), or throw up (vomit).  Your pain becomes localized to the abdomen.  You develop any new symptoms that seem different or that concern you. MAKE SURE YOU:   Understand these instructions.  Will watch your condition.  Will get help right away if you are not doing well or get worse.   This information is not intended to replace advice given to you by your health care provider. Make sure you discuss any questions you have with your health care provider.   Document Released: 09/27/2005 Document Revised: 12/20/2011 Document Reviewed: 06/01/2013 Elsevier Interactive Patient Education 2016 [employer].   Emergency Department Resource Guide 1) Find a Doctor and Pay Out of Pocket Although you won't have to find out who is covered by your insurance plan, it is a good idea to ask around and get recommendations. You will then need to call the office and see if the doctor you have chosen will accept you as  a new patient and what types of options they offer for patients who are self-pay. Some doctors offer discounts or will set up payment plans for their patients who do not have insurance, but you will need to ask so you aren't surprised when you get to your appointment.  2) Contact Your Local Health Department Not all health departments have doctors that can see patients for sick visits, but many do, so it is worth a call to see if yours does. If you  don't know where your local health department is, you can check in your phone book. The CDC also has a tool to help you locate your state's health department, and many state websites also have listings of all of their local health departments.  3) Find a Walk-in Clinic If your illness is not likely to be very severe or complicated, you may want to try a walk in clinic. These are popping up all over the country in pharmacies, drugstores, and shopping centers. They're usually staffed by nurse practitioners or physician assistants that have been trained to treat common illnesses and complaints. They're usually fairly quick and inexpensive. However, if you have serious medical issues or chronic medical problems, these are probably not your best option.  No Primary Care Doctor: - Call Health Connect at  [phone] - they can help you locate a primary care doctor that  accepts your insurance, provides certain services, etc. - Physician Referral Service- [phone]  Chronic Pain Problems: Organization         Address  Phone   Notes  [facility]  [phone] Patients need to be referred by their primary care doctor.   Medication Assistance: Organization         Address  Phone   Notes  [facility] Medication [facility] [address] [place] [place], [place] [address] [phone] --Must be a resident of [facility] -- Must have NO insurance coverage whatsoever (no Medicaid/ Medicare, etc.) -- The pt. MUST have a primary care doctor that directs their care regularly and follows them in the community   MedAssist  [phone]   [employer]  [phone]    Agencies that provide inexpensive medical care: Organization         Address  Phone   Notes  [facility]  [phone]   [facility]    [phone]   [facility] [address] [place], [place] [address] [phone]   [facility] [address] [place] [place] [phone]   Planned Parenthood    [phone]   [facility]    [phone]   [facility]  [address], [place] Phone:  [phone], Fax:  [phone] Hours of Operation:  9 am - 6 pm, M-F.  Also accepts Medicaid/Medicare and self-pay.  [facility]  [address], [place] Phone: [phone], Fax: [phone]. Hours of Operation:  8:30 am - 5:30 pm, M-F.  Also accepts Medicaid and self-pay.  [facility] [place] [address], [place] Phone: [phone]   [facility] [address] Natasha Bence [place], [place] [phone], Ext. [phone] Mondays & Thursdays: 7-9 AM.  First 15 patients are seen on a first come, first serve basis.    Medicaid-accepting [facility] (2-Rh) Providers:  Organization  Address  Phone   Notes  [facility] [address], [place] [phone] Also accepts self-pay patients.  [facility] [address] Laurell Josephs [place], [place]  [phone]   [facility] [address], [place] [phone]   [facility] [address] [place] [phone]   Renaye Rakers [address], [place]   [phone] Only accepts [place] Access [place] patients after they have their name applied to their card.   Self-Pay (no insurance) in [facility]:  Organization         Address  Phone   Notes  Sickle Cell Patients, [facility] [address] [place], [place] [phone]   [facility] [address] [place], [place] [phone]   [facility] [place]  [address], [place] [phone]   [facility]/Dr. Osei-Bonsu  [address], [place] or [address], [place] [phone] Phone  number for both [place] and [place] locations is the same.  [facility] [address] [place] [phone]   [facility] [address] [place] or [address] [phone]   [facility] [address] [place] [phone], phone; [phone], fax Sees patients 1st and 3rd Saturday of every month.  Must not qualify for public or private insurance (i.e. Medicaid, Medicare, [place] Health Choice, Veterans' Benefits)  Household income should be no more than 200% of the poverty level The clinic cannot treat you if you are pregnant or think you are pregnant  Sexually transmitted diseases are not treated at the clinic.    Dental Care: Organization         Address  Phone  Notes  [facility] Department of [facility] [address] [place], [place] [phone] Accepts children up to age 10 who are enrolled in [place] or [place] Health Choice; pregnant women with a Medicaid card; and children who have applied for Medicaid or [place] Health Choice, but were declined, whose parents can pay a reduced fee at time of service.  [facility] Department of [facility]  [address], [place] [phone] Accepts children up to age 45 who are enrolled in [place] or [place] Health Choice; pregnant women with a Medicaid card; and children who have applied for Medicaid or  Health Choice, but were declined, whose parents can pay a reduced fee at time of service.  [facility]  [address] [place], [place] [phone] Patients are seen by appointment only. Walk-ins are not accepted. [facility] will see patients 42 years of age and older. Monday - Tuesday (8am-5pm) Most Wednesdays (8:30-5pm) $30 per visit, cash only  [facility] Adult Dental Access PROGRAM  [address], [facility] [phone] Patients are seen by appointment only.  Walk-ins are not accepted. [facility] will see patients 68 years of age and older. One Wednesday Evening (Monthly: Volunteer Based).  $30 per visit, cash only  [employer]  [phone] for adults; Children under age 28, call Graduate Pediatric Dentistry at [phone]. Children aged 39-14, please call [phone] to request a pediatric application.  Dental services are provided in all areas of dental care including  fillings, crowns and bridges, complete and partial dentures, implants, gum treatment, root canals, and extractions. Preventive care is also provided. Treatment is provided to both adults and children. Patients are selected via a lottery and there is often a waiting list.   [facility] [address] [place]  [phone] [email or web address]   [facility] [address] [place], [place] [phone], Ext. [phone] Second and Fourth Thursday of each month, opens at 6:30 AM; Clinic ends at 9 AM.  Patients are seen on a first-come first-served basis, and a limited number are seen during each clinic.   [facility]  [address] Ether Griffins [place], [place] [phone]   Eligibility Requirements You must have lived in [place], [place], or [place] counties for at least the last three months.   You cannot be eligible for state or federal sponsored [place], including [employer], [place], or [employer].   You generally cannot be eligible for healthcare insurance through your employer.    How to apply: Eligibility screenings are held every Tuesday and Wednesday afternoon from 1:00 pm until 4:00 pm. You do not need an appointment for the interview!  [facility] [address], [place], [place] [phone]   [facility] Health Department  [phone]   [facility] Health Department  [phone]   [facility] Health Department  [phone]    Behavioral Health  Resources in the Community: Intensive Outpatient Programs Organization         Address  Phone  Notes  [facility] [address] [place], [place] [phone]   [facility] Outpatient [address], [place], [place] [phone]   [facility] [address], [place], [place]  [phone]   [facility] [address]  [place], [place] [phone] or [phone]   Substance Abuse Resources Organization         Address  Phone  Notes  Al[facility] Drug Services  [phone]   [facility]  [phone]   [facility] [place]  [phone]   Floydene Flock  [phone]   Residential & Outpatient Substance Abuse Program  [phone]   Psychological Services Organization         Address  Phone  Notes  [facility]  [phone]   [facility]  [phone]   [facility] [address], [place] [phone] or [phone]    Mobile Crisis Teams Organization         Address  Phone  Notes  [facility], Mobile Crisis Care Unit  [phone]   [facility]  [address] [place], [place] [phone]   Doristine Locks [address] [place] [phone]    Self-Help/Support Groups Organization         Address  Phone             Notes  [facility] [place] - variety of support groups  336- I7437963 Call for more information  Narcotics Anonymous (NA), [facility] [address], [employer] [place]  2 meetings at this location   Statistician         Address  Phone  Notes  [facility] [address],    [place]  [phone]   [facility]  [address] [place] [address], [place], [place] [phone]   [facility] [address] [place], [place] [phone] Admissions: 8am-3pm M-F  [facility] [address]    [place], [place] [phone]   [facility] [address] [place], [place], [place] [phone]   [facility] [address],  [place], [place] [phone]   [facility] - Intensive Outpatient [address] Laurell Josephs [address], [place], [place] [phone]   [facility] ([facility] [address]  [place], [place] [phone] or [phone]   [facility] ([facility]) [address] [place] [address], [place], [place] [phone] Accepts Medicaid  [facility] [address]  [place] [phone] Substance Abuse/Addiction Treatment   [facility] Organization         Address  Phone  Notes  [facility]  [phone]   Angie Fava, PhD [address] Ervin Knack [place], [place]   [phone] or [phone]   [facility] Behavioral   [address] [place], [place] [phone]   [facility] [address] [place], [place] [phone] Insurance/Medicaid/sponsorship through [facility] [address]                                    [place], [place] [phone] Therapy/tele-psych/case  [facility] [address], [place] [phone]    Dr. Lolly Mustache  [phone]   [facility]  [employer] [facility] Dept. 1) [address] [place] [address] [place] 2) [address], [place] 3)  [address], [place] [phone]  [phone]   [facility] Child Abuse Hotline (708)100-1835 or [phone] (After Hours)

## 2015-12-08 NOTE — ED Notes (Signed)
Pt reports pain/swelling to right wrist x 2 days. Hx of injury to shoulder. Pt loads/unloads docks for work and states it has been aggravated x 2 days.

## 2016-08-10 ENCOUNTER — Inpatient Hospital Stay (HOSPITAL_COMMUNITY)
Admission: EM | Admit: 2016-08-10 | Discharge: 2016-08-15 | DRG: 964 | Disposition: A | Discharge status: Home / Self Care | Payer: Self-pay | Attending: Emergency Medicine | Admitting: Emergency Medicine

## 2016-08-10 ENCOUNTER — Emergency Department (HOSPITAL_COMMUNITY): Payer: Self-pay

## 2016-08-10 ENCOUNTER — Encounter (HOSPITAL_COMMUNITY): Payer: Self-pay | Admitting: Emergency Medicine

## 2016-08-10 DIAGNOSIS — Z886 Allergy status to analgesic agent status: Secondary | ICD-10-CM

## 2016-08-10 DIAGNOSIS — D62 Acute posthemorrhagic anemia: Secondary | ICD-10-CM | POA: Diagnosis not present

## 2016-08-10 DIAGNOSIS — S21139A Puncture wound without foreign body of unspecified front wall of thorax without penetration into thoracic cavity, initial encounter: Secondary | ICD-10-CM | POA: Diagnosis present

## 2016-08-10 DIAGNOSIS — Y92524 Gas station as the place of occurrence of the external cause: Secondary | ICD-10-CM

## 2016-08-10 DIAGNOSIS — Z8709 Personal history of other diseases of the respiratory system: Secondary | ICD-10-CM

## 2016-08-10 DIAGNOSIS — F17210 Nicotine dependence, cigarettes, uncomplicated: Secondary | ICD-10-CM | POA: Diagnosis present

## 2016-08-10 DIAGNOSIS — S36113A Laceration of liver, unspecified degree, initial encounter: Secondary | ICD-10-CM | POA: Diagnosis present

## 2016-08-10 DIAGNOSIS — Z938 Other artificial opening status: Secondary | ICD-10-CM

## 2016-08-10 DIAGNOSIS — S272XXA Traumatic hemopneumothorax, initial encounter: Principal | ICD-10-CM | POA: Diagnosis present

## 2016-08-10 DIAGNOSIS — W3400XA Accidental discharge from unspecified firearms or gun, initial encounter: Secondary | ICD-10-CM

## 2016-08-10 DIAGNOSIS — J939 Pneumothorax, unspecified: Secondary | ICD-10-CM

## 2016-08-10 DIAGNOSIS — J942 Hemothorax: Secondary | ICD-10-CM

## 2016-08-10 DIAGNOSIS — Z885 Allergy status to narcotic agent status: Secondary | ICD-10-CM

## 2016-08-10 DIAGNOSIS — S2231XA Fracture of one rib, right side, initial encounter for closed fracture: Secondary | ICD-10-CM | POA: Diagnosis present

## 2016-08-10 HISTORY — PX: CHEST TUBE INSERTION: SHX231

## 2016-08-10 HISTORY — DX: Sickle-cell trait: D57.3

## 2016-08-10 HISTORY — DX: Unspecified osteoarthritis, unspecified site: M19.90

## 2016-08-10 HISTORY — DX: Accidental discharge from unspecified firearms or gun, initial encounter: W34.00XA

## 2016-08-10 LAB — COMPREHENSIVE METABOLIC PANEL
ALK PHOS: 51 U/L (ref 38–126)
ALT: 31 U/L (ref 17–63)
AST: 38 U/L (ref 15–41)
Albumin: 4.4 g/dL (ref 3.5–5.0)
Anion gap: 12 (ref 5–15)
BUN: 10 mg/dL (ref 6–20)
CALCIUM: 9.1 mg/dL (ref 8.9–10.3)
CHLORIDE: 105 mmol/L (ref 101–111)
CO2: 22 mmol/L (ref 22–32)
CREATININE: 1.29 mg/dL — AB (ref 0.61–1.24)
GFR calc non Af Amer: >60 mL/min (ref >60)
GLUCOSE: 110 mg/dL — AB (ref 65–99)
Potassium: 3.8 mmol/L (ref 3.5–5.1)
SODIUM: 139 mmol/L (ref 135–145)
Total Bilirubin: 0.3 mg/dL (ref 0.3–1.2)
Total Protein: 7.6 g/dL (ref 6.5–8.1)

## 2016-08-10 LAB — TYPE AND SCREEN
ABO/RH(D): O POS
ANTIBODY SCREEN: NEGATIVE
UNIT DIVISION: 0
Unit division: 0

## 2016-08-10 LAB — CBC
HCT: 42.7 % (ref 39.0–52.0)
HCT: 39.9 % (ref 39.0–52.0)
HEMATOCRIT: 40.7 % (ref 39.0–52.0)
Hemoglobin: 14.6 g/dL (ref 13.0–17.0)
Hemoglobin: 13.5 g/dL (ref 13.0–17.0)
Hemoglobin: 13.4 g/dL (ref 13.0–17.0)
MCH: 29.4 pg (ref 26.0–34.0)
MCH: 29.2 pg (ref 26.0–34.0)
MCH: 29.6 pg (ref 26.0–34.0)
MCHC: 34.2 g/dL (ref 30.0–36.0)
MCHC: 33.2 g/dL (ref 30.0–36.0)
MCHC: 33.6 g/dL (ref 30.0–36.0)
MCV: 86.1 fL (ref 78.0–100.0)
MCV: 87.9 fL (ref 78.0–100.0)
MCV: 88.1 fL (ref 78.0–100.0)
PLATELETS: 237 10*3/uL (ref 150–400)
PLATELETS: 216 10*3/uL (ref 150–400)
Platelets: 236 10*3/uL (ref 150–400)
RBC: 4.96 MIL/uL (ref 4.22–5.81)
RBC: 4.63 MIL/uL (ref 4.22–5.81)
RBC: 4.53 MIL/uL (ref 4.22–5.81)
RDW: 13.7 % (ref 11.5–15.5)
RDW: 14.2 % (ref 11.5–15.5)
RDW: 14.3 % (ref 11.5–15.5)
WBC: 10.7 10*3/uL — ABNORMAL HIGH (ref 4.0–10.5)
WBC: 22 10*3/uL — ABNORMAL HIGH (ref 4.0–10.5)
WBC: 11 10*3/uL — ABNORMAL HIGH (ref 4.0–10.5)

## 2016-08-10 LAB — I-STAT CG4 LACTIC ACID, ED: Lactic Acid, Venous: 3.84 mmol/L (ref 0.5–1.9)

## 2016-08-10 LAB — URINALYSIS, ROUTINE W REFLEX MICROSCOPIC
BILIRUBIN URINE: NEGATIVE
GLUCOSE, UA: NEGATIVE mg/dL
HGB URINE DIPSTICK: NEGATIVE
KETONES UR: NEGATIVE mg/dL
Leukocytes, UA: NEGATIVE
Nitrite: NEGATIVE
PH: 5.5 (ref 5.0–8.0)
PROTEIN: NEGATIVE mg/dL
Specific Gravity, Urine: 1.021 (ref 1.005–1.030)

## 2016-08-10 LAB — I-STAT CHEM 8, ED
BUN: 12 mg/dL (ref 6–20)
CHLORIDE: 102 mmol/L (ref 101–111)
CREATININE: 1.5 mg/dL — AB (ref 0.61–1.24)
Calcium, Ion: 1.07 mmol/L — ABNORMAL LOW (ref 1.15–1.40)
GLUCOSE: 105 mg/dL — AB (ref 65–99)
HEMATOCRIT: 45 % (ref 39.0–52.0)
Hemoglobin: 15.3 g/dL (ref 13.0–17.0)
POTASSIUM: 4.2 mmol/L (ref 3.5–5.1)
Sodium: 141 mmol/L (ref 135–145)
TCO2: 27 mmol/L (ref 0–100)

## 2016-08-10 LAB — PREPARE FRESH FROZEN PLASMA
UNIT DIVISION: 0
Unit division: 0

## 2016-08-10 LAB — ETHANOL: ALCOHOL ETHYL (B): 192 mg/dL — AB (ref <5)

## 2016-08-10 LAB — PROTIME-INR
INR: 0.92
PROTHROMBIN TIME: 12.3 s (ref 11.4–15.2)

## 2016-08-10 LAB — CDS SEROLOGY

## 2016-08-10 LAB — ABO/RH: ABO/RH(D): O POS

## 2016-08-10 MED ORDER — LIDOCAINE HCL (PF) 1 % IJ SOLN
30.0000 mL | Freq: Once | INTRAMUSCULAR | Status: AC
  Administered 2016-08-10: 30 mL

## 2016-08-10 MED ORDER — MORPHINE SULFATE 2 MG/ML IJ SOLN
INTRAMUSCULAR | Status: AC | PRN
  Administered 2016-08-10: 4 mg via INTRAVENOUS

## 2016-08-10 MED ORDER — IOPAMIDOL (ISOVUE-300) INJECTION 61%
100.0000 mL | Freq: Once | INTRAVENOUS | Status: AC | PRN
  Administered 2016-08-10: 100 mL via INTRAVENOUS

## 2016-08-10 MED ORDER — HYDROCODONE-ACETAMINOPHEN 5-325 MG PO TABS
2.0000 | ORAL_TABLET | ORAL | Status: DC | PRN
  Administered 2016-08-10 (×2): 2 via ORAL
  Filled 2016-08-10 (×2): qty 2

## 2016-08-10 MED ORDER — HYDROMORPHONE HCL 2 MG/ML IJ SOLN
1.0000 mg | Freq: Once | INTRAMUSCULAR | Status: DC
  Filled 2016-08-10: qty 1

## 2016-08-10 MED ORDER — HYDROMORPHONE HCL 2 MG/ML IJ SOLN
2.0000 mg | Freq: Once | INTRAMUSCULAR | Status: AC
  Administered 2016-08-10: 2 mg via INTRAVENOUS

## 2016-08-10 MED ORDER — DIPHENHYDRAMINE HCL 25 MG PO CAPS
25.0000 mg | ORAL_CAPSULE | ORAL | Status: DC | PRN
  Administered 2016-08-10 – 2016-08-11 (×5): 25 mg via ORAL
  Filled 2016-08-10 (×5): qty 1

## 2016-08-10 MED ORDER — HYDROCODONE-ACETAMINOPHEN 10-325 MG PO TABS
0.5000 | ORAL_TABLET | ORAL | Status: DC | PRN
  Administered 2016-08-10 – 2016-08-12 (×8): 2 via ORAL
  Administered 2016-08-12: 1 via ORAL
  Administered 2016-08-12 – 2016-08-15 (×11): 2 via ORAL
  Filled 2016-08-10 (×20): qty 2

## 2016-08-10 MED ORDER — ONDANSETRON HCL 4 MG PO TABS: 4.0000 mg | ORAL_TABLET | Freq: Four times a day (QID) | ORAL | Status: DC | PRN

## 2016-08-10 MED ORDER — MORPHINE SULFATE (PF) 2 MG/ML IV SOLN
2.0000 mg | INTRAVENOUS | Status: DC | PRN
  Administered 2016-08-10 – 2016-08-11 (×3): 2 mg via INTRAVENOUS
  Filled 2016-08-10 (×3): qty 1

## 2016-08-10 MED ORDER — ONDANSETRON HCL 4 MG/2ML IJ SOLN
4.0000 mg | Freq: Once | INTRAMUSCULAR | Status: AC
  Administered 2016-08-10: 4 mg via INTRAVENOUS
  Filled 2016-08-10: qty 2

## 2016-08-10 MED ORDER — ONDANSETRON HCL 4 MG/2ML IJ SOLN: 4.0000 mg | Freq: Four times a day (QID) | INTRAMUSCULAR | Status: DC | PRN

## 2016-08-10 MED ORDER — DEXTROSE-NACL 5-0.9 % IV SOLN
INTRAVENOUS | Status: DC
  Administered 2016-08-10 – 2016-08-11 (×3): via INTRAVENOUS

## 2016-08-10 MED ORDER — HYDROMORPHONE HCL 2 MG/ML IJ SOLN
1.0000 mg | INTRAMUSCULAR | Status: DC | PRN
  Administered 2016-08-10: 2 mg via INTRAVENOUS
  Filled 2016-08-10 (×2): qty 1

## 2016-08-10 MED ORDER — LIDOCAINE HCL (PF) 1 % IJ SOLN
INTRAMUSCULAR | Status: AC
  Filled 2016-08-10: qty 30

## 2016-08-10 MED ORDER — SODIUM CHLORIDE 0.9 % IV SOLN
INTRAVENOUS | Status: AC | PRN
Start: 2016-08-10 — End: 2016-08-10
  Administered 2016-08-10: 1000 mL via INTRAVENOUS

## 2016-08-10 MED ORDER — MORPHINE SULFATE (PF) 4 MG/ML IV SOLN
INTRAVENOUS | Status: AC
  Filled 2016-08-10: qty 1

## 2016-08-10 NOTE — Progress Notes (Signed)
   08/10/16 0800  Clinical Encounter Type  Visited With Patient;Health care provider;Other (Comment) (law enforcement)  Visit Type ED;Trauma  Referral From Nurse;Other (Comment) (Patent examinerlaw enforcement)  Spiritual Encounters  Spiritual Needs Emotional;Prayer  CH stayed in ED to assist in trying to identify and locate family; CH prayed with patient at bedside; [facility]; By 05:15 family had not been notified.

## 2016-08-10 NOTE — Procedures (Signed)
Chest Tube Insertion Procedure Note  Indications:  Clinically significant Hemothorax  Pre-operative Diagnosis: Right Hemothorax  Post-operative Diagnosis: Right Hemothorax, PTX  Procedure Details  Informed consent was obtained for the procedure, including sedation.  Risks of lung perforation, hemorrhage, arrhythmia, and adverse drug reaction were discussed.   After sterile skin prep, using standard technique, a 32 French tube was placed in the right lateral 5th rib space.  Findings: 20 ml of bloody fluid obtained  Estimated Blood Loss:  Minimal         Specimens:  None              Complications:  None; patient tolerated the procedure well.         Disposition: In ED post procedure         Condition: stable  Attending Attestation: I performed the procedure.

## 2016-08-10 NOTE — Progress Notes (Signed)
Pt is in obvious pain, airway is patent. RN/MD at bedside.
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
## 2016-08-11 ENCOUNTER — Encounter (HOSPITAL_COMMUNITY): Payer: Self-pay | Admitting: General Practice

## 2016-08-11 LAB — BLOOD PRODUCT ORDER (VERBAL) VERIFICATION

## 2016-08-11 LAB — CBC
HCT: 36.5 % — ABNORMAL LOW (ref 39.0–52.0)
Hemoglobin: 12.1 g/dL — ABNORMAL LOW (ref 13.0–17.0)
MCH: 29.1 pg (ref 26.0–34.0)
MCHC: 33.2 g/dL (ref 30.0–36.0)
MCV: 87.7 fL (ref 78.0–100.0)
PLATELETS: 210 10*3/uL (ref 150–400)
RBC: 4.16 MIL/uL — AB (ref 4.22–5.81)
RDW: 14 % (ref 11.5–15.5)
WBC: 10.1 10*3/uL (ref 4.0–10.5)

## 2016-08-11 MED ORDER — HEPARIN SODIUM (PORCINE) 5000 UNIT/ML IJ SOLN
5000.0000 [IU] | Freq: Three times a day (TID) | INTRAMUSCULAR | Status: DC
  Administered 2016-08-11 – 2016-08-12 (×3): 5000 [IU] via SUBCUTANEOUS
  Filled 2016-08-11 (×3): qty 1

## 2016-08-11 MED ORDER — HYDROXYZINE HCL 10 MG PO TABS
10.0000 mg | ORAL_TABLET | Freq: Four times a day (QID) | ORAL | Status: DC | PRN
  Administered 2016-08-11 – 2016-08-13 (×4): 10 mg via ORAL
  Filled 2016-08-11 (×6): qty 1
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
## 2016-08-12 ENCOUNTER — Inpatient Hospital Stay (HOSPITAL_COMMUNITY): Payer: Self-pay

## 2016-08-12 DIAGNOSIS — S36113A Laceration of liver, unspecified degree, initial encounter: Secondary | ICD-10-CM | POA: Diagnosis present

## 2016-08-12 DIAGNOSIS — D62 Acute posthemorrhagic anemia: Secondary | ICD-10-CM | POA: Diagnosis not present

## 2016-08-12 DIAGNOSIS — S272XXA Traumatic hemopneumothorax, initial encounter: Secondary | ICD-10-CM | POA: Diagnosis present

## 2016-08-12 DIAGNOSIS — S2231XA Fracture of one rib, right side, initial encounter for closed fracture: Secondary | ICD-10-CM | POA: Diagnosis present

## 2016-08-12 LAB — BASIC METABOLIC PANEL
Anion gap: 7 (ref 5–15)
CALCIUM: 8.9 mg/dL (ref 8.9–10.3)
CO2: 30 mmol/L (ref 22–32)
CREATININE: 1.11 mg/dL (ref 0.61–1.24)
Chloride: 100 mmol/L — ABNORMAL LOW (ref 101–111)
GFR calc Af Amer: >60 mL/min (ref >60)
GLUCOSE: 103 mg/dL — AB (ref 65–99)
POTASSIUM: 3.9 mmol/L (ref 3.5–5.1)
SODIUM: 137 mmol/L (ref 135–145)

## 2016-08-12 LAB — CBC
HCT: 35.6 % — ABNORMAL LOW (ref 39.0–52.0)
Hemoglobin: 11.9 g/dL — ABNORMAL LOW (ref 13.0–17.0)
MCH: 29.5 pg (ref 26.0–34.0)
MCHC: 33.4 g/dL (ref 30.0–36.0)
MCV: 88.3 fL (ref 78.0–100.0)
PLATELETS: 217 10*3/uL (ref 150–400)
RBC: 4.03 MIL/uL — AB (ref 4.22–5.81)
RDW: 13.8 % (ref 11.5–15.5)
WBC: 8.6 10*3/uL (ref 4.0–10.5)

## 2016-08-12 MED ORDER — NAPROXEN 250 MG PO TABS
500.0000 mg | ORAL_TABLET | Freq: Two times a day (BID) | ORAL | Status: DC
  Administered 2016-08-12 – 2016-08-15 (×7): 500 mg via ORAL
  Filled 2016-08-12 (×7): qty 2

## 2016-08-12 MED ORDER — ENOXAPARIN SODIUM 40 MG/0.4ML ~~LOC~~ SOLN
40.0000 mg | SUBCUTANEOUS | Status: DC
  Administered 2016-08-12 – 2016-08-15 (×4): 40 mg via SUBCUTANEOUS
  Filled 2016-08-12 (×4): qty 0.4

## 2016-08-12 MED ORDER — POLYETHYLENE GLYCOL 3350 17 G PO PACK
17.0000 g | PACK | Freq: Every day | ORAL | Status: DC
  Administered 2016-08-12 – 2016-08-14 (×3): 17 g via ORAL
  Filled 2016-08-12 (×3): qty 1

## 2016-08-12 MED ORDER — DOCUSATE SODIUM 100 MG PO CAPS
100.0000 mg | ORAL_CAPSULE | Freq: Two times a day (BID) | ORAL | Status: DC
  Administered 2016-08-12 – 2016-08-15 (×7): 100 mg via ORAL
  Filled 2016-08-12 (×7): qty 1

## 2016-08-12 MED ORDER — MORPHINE SULFATE (PF) 2 MG/ML IV SOLN
2.0000 mg | INTRAVENOUS | Status: DC | PRN
  Administered 2016-08-13 – 2016-08-14 (×2): 2 mg via INTRAVENOUS
  Filled 2016-08-12 (×2): qty 1
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
## 2016-08-13 ENCOUNTER — Inpatient Hospital Stay (HOSPITAL_COMMUNITY): Payer: Self-pay
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
## 2016-08-13 NOTE — Progress Notes (Signed)
Chest tube dressing changed as pt thought tube had migrated after it got caught up in the bed. Old dressing removed, old drainage noted. Site cleaned and redressed with gauze and tape

## 2016-08-14 ENCOUNTER — Inpatient Hospital Stay (HOSPITAL_COMMUNITY): Payer: Self-pay

## 2016-08-14 MED ORDER — HYDROCODONE-ACETAMINOPHEN 5-325 MG PO TABS: 1.0000 | ORAL_TABLET | Freq: Four times a day (QID) | ORAL | 0 refills | Status: DC | PRN

## 2016-08-14 NOTE — Progress Notes (Signed)
Right chest tube was removed this morning by Dr Andrey CampanileWilson, dressing dry and intact. Pt is ambulating in the hall independently, no SOB noted.  Chest XR done at 1600, result with small pneumothorax, relayed to Dr Derrell Lollingamirez. Will rpt CXR in AM.
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
## 2016-08-14 NOTE — Discharge Instructions (Signed)
No contact sports for 6 weeks Avoid smoking  KEEP VASELINE GAUZE ON DIRECTLY ON SKIN FOR NEXT 48 HRS. CHANGE OUTER GAUZE AS NEEDED FOR DRAINAGE. MAY SHOWER IN 48HRS. ON Monday, CAN REMOVE VASELINE GAUZE AND JUST COVER OLD CHEST TUBE SITE WITH DRY GAUZE  Pneumothorax A pneumothorax, commonly called a collapsed lung, is a condition in which air leaks from a lung and builds up in the space between the lung and the chest wall (pleural space). The air in a pneumothorax is trapped outside the lung and takes up space, preventing the lung from fully expanding. This is a condition that usually occurs suddenly. The buildup of air may be small or large. A small pneumothorax may go away on its own. When a pneumothorax is larger, it will often require medical treatment and hospitalization.  CAUSES  A pneumothorax can sometimes happen quickly with no apparent cause. People with underlying lung problems, particularly COPD or emphysema, are at higher risk of pneumothorax. However, pneumothorax can happen quickly even in people with no prior known lung problems. Trauma, surgery, medical procedures, or injury to the chest wall can also cause a pneumothorax. SIGNS AND SYMPTOMS  Sometimes a pneumothorax will have no symptoms. When symptoms are present, they can include:  Chest pain.  Shortness of breath.  Increased rate of breathing.  Bluish color to your lips or skin (cyanosis). DIAGNOSIS  Pneumothorax is usually diagnosed by a chest X-ray or chest CT scan. Your health care provider will also take a medical history and perform a physical exam to determine why you may have a pneumothorax. TREATMENT  A small pneumothorax may go away on its own without treatment. Extra oxygen can sometimes help a small pneumothorax go away more quickly. For a larger pneumothorax or a pneumothorax that is causing symptoms, a procedure is usually needed to drain the air.In some cases, the health care provider may drain the air using a  needle. In other cases, a chest tube may be inserted into the pleural space. A chest tube is a small tube placed between the ribs and into the pleural space. This removes the extra air and allows the lung to expand back to its normal size. A large pneumothorax will usually require a hospital stay. If there is ongoing air leakage into the pleural space, then the chest tube may need to remain in place for several days until the air leak has healed. In some cases, surgery may be needed.  HOME CARE INSTRUCTIONS   Only take over-the-counter or prescription medicines as directed by your health care provider.  If a cough or pain makes it difficult for you to sleep at night, try sleeping in a semi-upright position in a recliner or by using 2 or 3 pillows.  Rest and limit activity as directed by your health care provider.  If you had a chest tube and it was removed, ask your health care provider when it is okay to remove the dressing. Until your health care provider says you can remove the dressing, do not allow it to get wet.  Do not smoke. Smoking is a risk factor for pneumothorax.  Do not fly in an airplane or scuba dive until your health care provider says it is okay.  Follow up with your health care provider as directed. SEEK IMMEDIATE MEDICAL CARE IF:   You have increasing chest pain or shortness of breath.  You have a cough that is not controlled with suppressants.  You begin coughing up blood.  You have pain that is getting worse or is not controlled with medicines.  You cough up thick, discolored mucus (sputum) that is yellow to green in color.  You have redness, increasing pain, or discharge at the site where a chest tube had been in place (if your pneumothorax was treated with a chest tube).  The site where your chest tube was located opens up.  You feel air coming out of the site where the chest tube was placed.  You have a fever or persistent symptoms for more than 2-3  days.  You have a fever and your symptoms suddenly get worse. MAKE SURE YOU:   Understand these instructions.  Will watch your condition.  Will get help right away if you are not doing well or get worse.   This information is not intended to replace advice given to you by your health care provider. Make sure you discuss any questions you have with your health care provider.   Document Released: 09/27/2005 Document Revised: 07/18/2013 Document Reviewed: 04/26/2013 Elsevier Interactive Patient Education 2016 Elsevier Inc.  Abdominal Trauma abdominal trauma is a type of injury that involves damage to the abdominal wall or to abdominal organs, such as the liver or spleen. The damage can involve bruising, tearing, or a rupture.  CAUSES This injury is caused by a hard, direct hit to the abdomen. It can happen after:  A motor vehicle accident.  Being kicked or punched in the abdomen.  Falling from a significant height. RISK FACTORS This injury is more likely to happen in people who:  Play contact sports.  Work in a job in which falls or injuries are more likely, such as in Holiday representative. SYMPTOMS The main symptom of this condition is pain in the abdomen. Other symptoms depend on the type and location of the injury. They can include:  Abdominal pain that spreads to the the back or shoulder.  Bruising.  Swelling.  Pain when pressing on the abdomen.  Blood in the urine.  Weakness.  Confusion.  Loss of consciousness.  Pale, dusky, cool, or sweaty skin.  Vomiting blood.  Bloody stool or bleeding from the rectum.  Trouble breathing. Symptoms of this injury can develop suddenly or slowly.  DIAGNOSIS This injury is diagnosed based on your symptoms and a physical exam. You may also have tests, including:  Blood tests.  Urine tests.  Imaging tests, such as:  A CT scan and ultrasound of your abdomen.  X-rays of your chest and abdomen.  A test in which a tube is  used to flush your abdomen with fluid and check for blood (diagnostic peritoneal lavage). TREATMENT Treatment for this injury depends on its type and severity. Treatment options include:  Observation. If the injury is mild, this may be the only treatment needed.  Support of your blood pressure and breathing.  Getting blood, fluids, or medicine through an IV tube.  Antibiotic medicine.  Insertion of tubes into the stomach or bladder.  A blood transfusion.  A procedure to stop bleeding. This involves putting a long, thin tube (catheter) into one of your blood vessels (angiographic embolization).  Surgery to open up your abdomen and control bleeding or repair damage (laparotomy). This may be done if tests suggest that you have peritonitis or bleeding that cannot be controlled with angiographic embolization. HOME CARE INSTRUCTIONS  Take medicines only as directed by your health care provider.  If you were prescribed an antibiotic medicine, finish all of it even if you start to  feel better.  Follow your health care provider's instructions about diet and activity restrictions.  Keep all follow-up visits as directed by your health care provider. This is important. SEEK MEDICAL CARE IF:  You continue to have abdominal pain.  Your symptoms return.  You develop new symptoms.  You have blood in your urine or your bowel movements. SEEK IMMEDIATE MEDICAL CARE IF:  You vomit blood.  You have heavy bleeding from your rectum.  You have very bad abdominal pain.  You have trouble breathing.  You have chest pain.  You have a fever.  You have dizziness.  You pass out.   This information is not intended to replace advice given to you by your health care provider. Make sure you discuss any questions you have with your health care provider.   Document Released: 11/04/2004 Document Revised: 02/11/2015 Document Reviewed: 09/18/2014 Elsevier Interactive Patient Education [employer]2016  Elsevier Inc.

## 2016-08-15 ENCOUNTER — Inpatient Hospital Stay (HOSPITAL_COMMUNITY): Payer: Self-pay

## 2016-08-15 NOTE — Progress Notes (Signed)
Pt discharged to home accomp by wife.  Rx given and explained.  Letter for work given and explained.  Follow up appointments explained.  No further questions verbalized about home self care.
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
## 2016-08-20 ENCOUNTER — Encounter (HOSPITAL_COMMUNITY): Payer: Self-pay | Admitting: *Deleted
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
## 2016-09-21 ENCOUNTER — Emergency Department (HOSPITAL_COMMUNITY)
Admission: EM | Admit: 2016-09-21 | Discharge: 2016-09-21 | Disposition: A | Discharge status: Home / Self Care | Payer: Self-pay | Attending: Emergency Medicine | Admitting: Emergency Medicine

## 2016-09-21 ENCOUNTER — Emergency Department (HOSPITAL_COMMUNITY): Payer: Self-pay

## 2016-09-21 ENCOUNTER — Encounter (HOSPITAL_COMMUNITY): Payer: Self-pay | Admitting: Emergency Medicine

## 2016-09-21 DIAGNOSIS — Z23 Encounter for immunization: Secondary | ICD-10-CM | POA: Insufficient documentation

## 2016-09-21 DIAGNOSIS — F17210 Nicotine dependence, cigarettes, uncomplicated: Secondary | ICD-10-CM | POA: Insufficient documentation

## 2016-09-21 DIAGNOSIS — S61402A Unspecified open wound of left hand, initial encounter: Secondary | ICD-10-CM | POA: Insufficient documentation

## 2016-09-21 DIAGNOSIS — S62325B Displaced fracture of shaft of fourth metacarpal bone, left hand, initial encounter for open fracture: Secondary | ICD-10-CM | POA: Insufficient documentation

## 2016-09-21 DIAGNOSIS — F17200 Nicotine dependence, unspecified, uncomplicated: Secondary | ICD-10-CM | POA: Insufficient documentation

## 2016-09-21 DIAGNOSIS — Y929 Unspecified place or not applicable: Secondary | ICD-10-CM | POA: Insufficient documentation

## 2016-09-21 DIAGNOSIS — W3400XA Accidental discharge from unspecified firearms or gun, initial encounter: Secondary | ICD-10-CM | POA: Insufficient documentation

## 2016-09-21 DIAGNOSIS — Y999 Unspecified external cause status: Secondary | ICD-10-CM | POA: Insufficient documentation

## 2016-09-21 DIAGNOSIS — Y939 Activity, unspecified: Secondary | ICD-10-CM | POA: Insufficient documentation

## 2016-09-21 DIAGNOSIS — Y92524 Gas station as the place of occurrence of the external cause: Secondary | ICD-10-CM | POA: Insufficient documentation

## 2016-09-21 DIAGNOSIS — Y9389 Activity, other specified: Secondary | ICD-10-CM | POA: Insufficient documentation

## 2016-09-21 MED ORDER — HYDROCODONE-ACETAMINOPHEN 5-325 MG PO TABS
1.0000 | ORAL_TABLET | Freq: Once | ORAL | Status: AC
  Administered 2016-09-21: 1 via ORAL
  Filled 2016-09-21: qty 1

## 2016-09-21 MED ORDER — TETANUS-DIPHTH-ACELL PERTUSSIS 5-2.5-18.5 LF-MCG/0.5 IM SUSP
0.5000 mL | Freq: Once | INTRAMUSCULAR | Status: AC
  Administered 2016-09-21: 0.5 mL via INTRAMUSCULAR
  Filled 2016-09-21: qty 0.5

## 2016-09-21 MED ORDER — CEPHALEXIN 500 MG PO CAPS: 500.0000 mg | ORAL_CAPSULE | Freq: Four times a day (QID) | ORAL | 0 refills | Status: DC

## 2016-09-21 MED ORDER — HYDROMORPHONE HCL 2 MG/ML IJ SOLN
1.0000 mg | Freq: Once | INTRAMUSCULAR | Status: AC
  Administered 2016-09-21: 1 mg via INTRAVENOUS
  Filled 2016-09-21: qty 1

## 2016-09-21 MED ORDER — CEFAZOLIN IN D5W 1 GM/50ML IV SOLN
1.0000 g | Freq: Once | INTRAVENOUS | Status: AC
  Administered 2016-09-21: 1 g via INTRAVENOUS
  Filled 2016-09-21: qty 50

## 2016-09-21 MED ORDER — MORPHINE SULFATE (PF) 4 MG/ML IV SOLN
4.0000 mg | Freq: Once | INTRAVENOUS | Status: AC
  Administered 2016-09-21: 4 mg via INTRAVENOUS
  Filled 2016-09-21: qty 1

## 2016-09-21 MED ORDER — HYDROCODONE-ACETAMINOPHEN 5-325 MG PO TABS: 1.0000 | ORAL_TABLET | Freq: Four times a day (QID) | ORAL | 0 refills | Status: DC | PRN
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
## 2016-09-21 NOTE — ED Notes (Signed)
Ortho tech at bedside 

## 2016-09-21 NOTE — ED Notes (Signed)
Best number to reach patient at is [phone]
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
## 2016-09-22 ENCOUNTER — Emergency Department (HOSPITAL_COMMUNITY)
Admission: EM | Admit: 2016-09-22 | Discharge: 2016-09-22 | Disposition: A | Discharge status: Home / Self Care | Payer: Self-pay | Attending: Emergency Medicine | Admitting: Emergency Medicine

## 2016-09-22 ENCOUNTER — Ambulatory Visit (HOSPITAL_COMMUNITY)
Admission: RE | Admit: 2016-09-22 | Discharge: 2016-09-22 | Disposition: A | Discharge status: Home / Self Care | Payer: Self-pay | Source: Ambulatory Visit | Attending: Orthopedic Surgery | Admitting: Orthopedic Surgery

## 2016-09-22 ENCOUNTER — Encounter (HOSPITAL_COMMUNITY): Payer: Self-pay | Admitting: Certified Registered"

## 2016-09-22 ENCOUNTER — Encounter (HOSPITAL_COMMUNITY): Payer: Self-pay | Admitting: Emergency Medicine

## 2016-09-22 ENCOUNTER — Encounter (HOSPITAL_COMMUNITY)
Admission: RE | Disposition: A | Discharge status: Home / Self Care | Payer: Self-pay | Source: Ambulatory Visit | Attending: Orthopedic Surgery

## 2016-09-22 ENCOUNTER — Ambulatory Visit (HOSPITAL_COMMUNITY): Payer: Self-pay | Admitting: Certified Registered"

## 2016-09-22 DIAGNOSIS — Z89421 Acquired absence of other right toe(s): Secondary | ICD-10-CM | POA: Insufficient documentation

## 2016-09-22 DIAGNOSIS — Z5189 Encounter for other specified aftercare: Secondary | ICD-10-CM

## 2016-09-22 DIAGNOSIS — F17210 Nicotine dependence, cigarettes, uncomplicated: Secondary | ICD-10-CM | POA: Insufficient documentation

## 2016-09-22 DIAGNOSIS — Z885 Allergy status to narcotic agent status: Secondary | ICD-10-CM | POA: Insufficient documentation

## 2016-09-22 DIAGNOSIS — S62305B Unspecified fracture of fourth metacarpal bone, left hand, initial encounter for open fracture: Secondary | ICD-10-CM | POA: Insufficient documentation

## 2016-09-22 DIAGNOSIS — W3400XA Accidental discharge from unspecified firearms or gun, initial encounter: Secondary | ICD-10-CM | POA: Insufficient documentation

## 2016-09-22 DIAGNOSIS — D573 Sickle-cell trait: Secondary | ICD-10-CM | POA: Insufficient documentation

## 2016-09-22 HISTORY — PX: OPEN REDUCTION INTERNAL FIXATION (ORIF) METACARPAL: SHX6234

## 2016-09-22 SURGERY — OPEN REDUCTION INTERNAL FIXATION (ORIF) METACARPAL
Anesthesia: General | Site: Hand | Laterality: Left

## 2016-09-22 MED ORDER — FENTANYL CITRATE (PF) 100 MCG/2ML IJ SOLN
INTRAMUSCULAR | Status: AC
  Filled 2016-09-22: qty 2

## 2016-09-22 MED ORDER — DEXAMETHASONE SODIUM PHOSPHATE 10 MG/ML IJ SOLN
INTRAMUSCULAR | Status: AC
  Filled 2016-09-22: qty 1

## 2016-09-22 MED ORDER — BUPIVACAINE HCL (PF) 0.25 % IJ SOLN
INTRAMUSCULAR | Status: AC
  Filled 2016-09-22: qty 30

## 2016-09-22 MED ORDER — ROCURONIUM BROMIDE 50 MG/5ML IV SOSY
PREFILLED_SYRINGE | INTRAVENOUS | Status: AC
  Filled 2016-09-22: qty 10

## 2016-09-22 MED ORDER — CEPHALEXIN 500 MG PO CAPS: 500.0000 mg | ORAL_CAPSULE | Freq: Four times a day (QID) | ORAL | 0 refills | Status: DC

## 2016-09-22 MED ORDER — LIDOCAINE 2% (20 MG/ML) 5 ML SYRINGE
INTRAMUSCULAR | Status: AC
  Filled 2016-09-22: qty 10

## 2016-09-22 MED ORDER — PROPOFOL 10 MG/ML IV BOLUS
INTRAVENOUS | Status: DC | PRN
  Administered 2016-09-22: 200 mg via INTRAVENOUS

## 2016-09-22 MED ORDER — ONDANSETRON HCL 4 MG/2ML IJ SOLN: 4.0000 mg | Freq: Once | INTRAMUSCULAR | Status: DC | PRN

## 2016-09-22 MED ORDER — LACTATED RINGERS IV SOLN: INTRAVENOUS | Status: DC

## 2016-09-22 MED ORDER — ONDANSETRON HCL 4 MG/2ML IJ SOLN
INTRAMUSCULAR | Status: AC
  Filled 2016-09-22: qty 2

## 2016-09-22 MED ORDER — OXYCODONE-ACETAMINOPHEN 10-325 MG PO TABS: 1.0000 | ORAL_TABLET | ORAL | 0 refills | Status: DC | PRN

## 2016-09-22 MED ORDER — DOCUSATE SODIUM 100 MG PO CAPS: 100.0000 mg | ORAL_CAPSULE | Freq: Two times a day (BID) | ORAL | 0 refills | Status: DC

## 2016-09-22 MED ORDER — CEFAZOLIN SODIUM-DEXTROSE 2-4 GM/100ML-% IV SOLN
2.0000 g | INTRAVENOUS | Status: AC
  Administered 2016-09-22: 2 g via INTRAVENOUS

## 2016-09-22 MED ORDER — FENTANYL CITRATE (PF) 100 MCG/2ML IJ SOLN
INTRAMUSCULAR | Status: DC | PRN
Start: 2016-09-22 — End: 2016-09-22
  Administered 2016-09-22: 100 ug via INTRAVENOUS

## 2016-09-22 MED ORDER — LACTATED RINGERS IV SOLN
INTRAVENOUS | Status: DC | PRN
  Administered 2016-09-22 (×2): via INTRAVENOUS

## 2016-09-22 MED ORDER — DEXAMETHASONE SODIUM PHOSPHATE 10 MG/ML IJ SOLN
INTRAMUSCULAR | Status: DC | PRN
  Administered 2016-09-22: 10 mg via INTRAVENOUS

## 2016-09-22 MED ORDER — FENTANYL CITRATE (PF) 100 MCG/2ML IJ SOLN
25.0000 ug | INTRAMUSCULAR | Status: DC | PRN
  Administered 2016-09-22: 50 ug via INTRAVENOUS

## 2016-09-22 MED ORDER — 0.9 % SODIUM CHLORIDE (POUR BTL) OPTIME
TOPICAL | Status: DC | PRN
  Administered 2016-09-22: 1000 mL

## 2016-09-22 MED ORDER — OXYCODONE-ACETAMINOPHEN 5-325 MG PO TABS: 1.0000 | ORAL_TABLET | ORAL | 0 refills | Status: DC | PRN

## 2016-09-22 MED ORDER — ONDANSETRON HCL 4 MG/2ML IJ SOLN
INTRAMUSCULAR | Status: DC | PRN
  Administered 2016-09-22: 4 mg via INTRAVENOUS

## 2016-09-22 MED ORDER — CEPHALEXIN 250 MG PO CAPS
500.0000 mg | ORAL_CAPSULE | Freq: Once | ORAL | Status: AC
  Administered 2016-09-22: 500 mg via ORAL
  Filled 2016-09-22: qty 2

## 2016-09-22 MED ORDER — PROPOFOL 10 MG/ML IV BOLUS
INTRAVENOUS | Status: AC
  Filled 2016-09-22: qty 20

## 2016-09-22 MED ORDER — LIDOCAINE HCL (CARDIAC) 20 MG/ML IV SOLN
INTRAVENOUS | Status: DC | PRN
  Administered 2016-09-22: 50 mg via INTRAVENOUS

## 2016-09-22 MED ORDER — MIDAZOLAM HCL 2 MG/2ML IJ SOLN
INTRAMUSCULAR | Status: AC
  Filled 2016-09-22: qty 2

## 2016-09-22 MED ORDER — CHLORHEXIDINE GLUCONATE 4 % EX LIQD: 60.0000 mL | Freq: Once | CUTANEOUS | Status: DC

## 2016-09-22 MED ORDER — MIDAZOLAM HCL 5 MG/5ML IJ SOLN
INTRAMUSCULAR | Status: DC | PRN
  Administered 2016-09-22: 2 mg via INTRAVENOUS

## 2016-09-22 MED ORDER — OXYCODONE-ACETAMINOPHEN 5-325 MG PO TABS
1.0000 | ORAL_TABLET | Freq: Once | ORAL | Status: AC
  Administered 2016-09-22: 1 via ORAL
  Filled 2016-09-22: qty 1

## 2016-09-22 MED ORDER — BUPIVACAINE HCL (PF) 0.25 % IJ SOLN
INTRAMUSCULAR | Status: DC | PRN
  Administered 2016-09-22: 8 mL

## 2016-09-22 MED ORDER — CEFAZOLIN SODIUM-DEXTROSE 2-4 GM/100ML-% IV SOLN
INTRAVENOUS | Status: AC
  Filled 2016-09-22: qty 100

## 2016-09-22 SURGICAL SUPPLY — 64 items
BANDAGE ACE 4X5 VEL STRL LF (GAUZE/BANDAGES/DRESSINGS) ×3 IMPLANT
BANDAGE ELASTIC 3 VELCRO ST LF (GAUZE/BANDAGES/DRESSINGS) ×3 IMPLANT
BIT DRILL 1.1 (BIT) ×2
BIT DRILL 1.1MM (BIT) ×1
BIT DRILL 60X20X1.1XQC TMX (BIT) IMPLANT
BIT DRL 60X20X1.1XQC TMX (BIT) ×1
BLADE SURG ROTATE 9660 (MISCELLANEOUS) IMPLANT
BNDG ADH 5X3 H2O RPLNT NS (GAUZE/BANDAGES/DRESSINGS) ×1
BNDG ADH 5X4 AIR PERM ELC (GAUZE/BANDAGES/DRESSINGS) ×1
BNDG CMPR 9X4 STRL LF SNTH (GAUZE/BANDAGES/DRESSINGS) ×1
BNDG COHESIVE 3X5 WHT NS (GAUZE/BANDAGES/DRESSINGS) ×2 IMPLANT
BNDG COHESIVE 4X5 WHT NS (GAUZE/BANDAGES/DRESSINGS) ×2 IMPLANT
BNDG ESMARK 4X9 LF (GAUZE/BANDAGES/DRESSINGS) ×3 IMPLANT
BNDG GAUZE ELAST 4 BULKY (GAUZE/BANDAGES/DRESSINGS) ×3 IMPLANT
CLOSURE WOUND 1/2 X4 (GAUZE/BANDAGES/DRESSINGS)
CORDS BIPOLAR (ELECTRODE) ×3 IMPLANT
COVER SURGICAL LIGHT HANDLE (MISCELLANEOUS) ×3 IMPLANT
CUFF TOURNIQUET SINGLE 18IN (TOURNIQUET CUFF) ×3 IMPLANT
CUFF TOURNIQUET SINGLE 24IN (TOURNIQUET CUFF) IMPLANT
DRAIN TLS ROUND 10FR (DRAIN) IMPLANT
DRAPE OEC MINIVIEW 54X84 (DRAPES) ×3 IMPLANT
DRAPE SURG 17X11 SM STRL (DRAPES) ×3 IMPLANT
DRSG ADAPTIC 3X8 NADH LF (GAUZE/BANDAGES/DRESSINGS) ×3 IMPLANT
GAUZE SPONGE 4X4 12PLY STRL (GAUZE/BANDAGES/DRESSINGS) ×3 IMPLANT
GAUZE SPONGE 4X4 16PLY XRAY LF (GAUZE/BANDAGES/DRESSINGS) IMPLANT
GLOVE BIOGEL PI IND STRL 8.5 (GLOVE) ×1 IMPLANT
GLOVE BIOGEL PI INDICATOR 8.5 (GLOVE) ×2
GLOVE SURG ORTHO 8.0 STRL STRW (GLOVE) ×3 IMPLANT
GOWN STRL REUS W/ TWL LRG LVL3 (GOWN DISPOSABLE) ×3 IMPLANT
GOWN STRL REUS W/ TWL XL LVL3 (GOWN DISPOSABLE) ×1 IMPLANT
GOWN STRL REUS W/TWL LRG LVL3 (GOWN DISPOSABLE) ×9
GOWN STRL REUS W/TWL XL LVL3 (GOWN DISPOSABLE) ×3
KIT BASIN OR (CUSTOM PROCEDURE TRAY) ×3 IMPLANT
KIT ROOM TURNOVER OR (KITS) ×3 IMPLANT
LOCK SCREW 1.5X15MM (Screw) ×3 IMPLANT
MANIFOLD NEPTUNE II (INSTRUMENTS) ×3 IMPLANT
NDL HYPO 25X1 1.5 SAFETY (NEEDLE) ×1 IMPLANT
NEEDLE HYPO 25X1 1.5 SAFETY (NEEDLE) ×3 IMPLANT
NS IRRIG 1000ML POUR BTL (IV SOLUTION) ×3 IMPLANT
PACK ORTHO EXTREMITY (CUSTOM PROCEDURE TRAY) ×3 IMPLANT
PAD ARMBOARD 7.5X6 YLW CONV (MISCELLANEOUS) ×6 IMPLANT
PAD CAST 3X4 CTTN HI CHSV (CAST SUPPLIES) IMPLANT
PAD CAST 4YDX4 CTTN HI CHSV (CAST SUPPLIES) ×1 IMPLANT
PADDING CAST COTTON 3X4 STRL (CAST SUPPLIES) ×6
PADDING CAST COTTON 4X4 STRL (CAST SUPPLIES) ×3
PLATE STRAIGHT LOCK 1.5 (Plate) ×2 IMPLANT
SCREW 1.5X15MM (Screw) ×6 IMPLANT
SCREW LOCK 1.5X15MM (Screw) IMPLANT
SCREW NL 1.5X13 (Screw) ×8 IMPLANT
SOAP 2 % CHG 4 OZ (WOUND CARE) ×3 IMPLANT
STRIP CLOSURE SKIN 1/2X4 (GAUZE/BANDAGES/DRESSINGS) IMPLANT
SUT ETHILON 4 0 PS 2 18 (SUTURE) IMPLANT
SUT MNCRL AB 4-0 PS2 18 (SUTURE) IMPLANT
SUT PROLENE 3 0 PS 1 (SUTURE) ×8 IMPLANT
SUT VIC AB 2-0 FS1 27 (SUTURE) IMPLANT
SUT VICRYL 4-0 PS2 18IN ABS (SUTURE) IMPLANT
SYR CONTROL 10ML LL (SYRINGE) IMPLANT
SYSTEM CHEST DRAIN TLS 7FR (DRAIN) IMPLANT
TOWEL OR 17X24 6PK STRL BLUE (TOWEL DISPOSABLE) ×3 IMPLANT
TOWEL OR 17X26 10 PK STRL BLUE (TOWEL DISPOSABLE) ×3 IMPLANT
TUBE CONNECTING 12'X1/4 (SUCTIONS) ×1
TUBE CONNECTING 12X1/4 (SUCTIONS) ×2 IMPLANT
WATER STERILE IRR 1000ML POUR (IV SOLUTION) ×3 IMPLANT
YANKAUER SUCT BULB TIP NO VENT (SUCTIONS) IMPLANT

## 2016-09-22 NOTE — Anesthesia Preprocedure Evaluation (Signed)
Anesthesia Evaluation  Patient identified by MRN, date of birth, ID band Patient awake    Reviewed: Allergy & Precautions, NPO status , Patient's Chart, lab work & pertinent test results  Airway Mallampati: II  TM Distance: >3 FB Neck ROM: Full    Dental  (+) Teeth Intact, Dental Advisory Given   Pulmonary Current Smoker,  breath sounds clear to auscultation        Cardiovascular Rhythm:Regular Rate:Normal     Neuro/Psych    GI/Hepatic   Endo/Other    Renal/GU      Musculoskeletal   Abdominal   Peds  Hematology   Anesthesia Other Findings   Reproductive/Obstetrics                            Anesthesia Physical Anesthesia Plan  ASA: II  Anesthesia Plan: General   Post-op Pain Management:    Induction: Intravenous  Airway Management Planned: Oral ETT  Additional Equipment:   Intra-op Plan:   Post-operative Plan:   Informed Consent: I have reviewed the patients History and Physical, chart, labs and discussed the procedure including the risks, benefits and alternatives for the proposed anesthesia with the patient or authorized representative who has indicated his/her understanding and acceptance.   Dental advisory given  Plan Discussed with: CRNA and Anesthesiologist  Anesthesia Plan Comments:        Anesthesia Quick Evaluation  
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
## 2016-09-22 NOTE — Anesthesia Postprocedure Evaluation (Signed)
Anesthesia Post Note  Patient: Clayton Burton  Procedure(s) Performed: Procedure(s) (LRB): OPEN REDUCTION INTERNAL FIXATION (ORIF) METACARPAL/WOUND CLOSURE (Left)  Patient location during evaluation: PACU Anesthesia Type: General Level of consciousness: awake, awake and alert and oriented Pain management: pain level controlled Vital Signs Assessment: post-procedure vital signs reviewed and stable Respiratory status: spontaneous breathing, nonlabored ventilation and respiratory function stable Cardiovascular status: blood pressure returned to baseline Anesthetic complications: no    Last Vitals:  Vitals:   09/22/16 2010  Temp: 36.9 C    Last Pain: There were no vitals filed for this visit.               Zyrah Wiswell COKER

## 2016-09-22 NOTE — ED Notes (Signed)
Ortho tech at bedside 

## 2016-09-22 NOTE — Progress Notes (Signed)
Orthopedic Tech Progress Note Patient Details:  Clayton Burton April 17, 1981 [id number]  Ortho Devices Type of Ortho Device: Ulna gutter splint Ortho Device/Splint Location: lue Ortho Device/Splint Interventions: Ordered, Application   Clayton Burton, Clayton Burton 09/22/2016, 3:24 AM
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
## 2016-09-22 NOTE — Transfer of Care (Signed)
Immediate Anesthesia Transfer of Care Note  Patient: Clayton Burton  Procedure(s) Performed: Procedure(s): OPEN REDUCTION INTERNAL FIXATION (ORIF) METACARPAL/WOUND CLOSURE (Left)  Patient Location: PACU  Anesthesia Type:General  Level of Consciousness: awake and alert   Airway & Oxygen Therapy: Patient Spontanous Breathing and Patient connected to nasal cannula oxygen  Post-op Assessment: Report given to RN and Post -op Vital signs reviewed and stable  Post vital signs: Reviewed and stable  Last Vitals:  Vitals:   09/22/16 2010  Temp: 36.9 C    Last Pain: There were no vitals filed for this visit.    Patients Stated Pain Goal: 3 (09/22/16 1636)  Complications: No apparent anesthesia complications

## 2016-09-22 NOTE — Anesthesia Procedure Notes (Signed)
Procedure Name: LMA Insertion Date/Time: 09/22/2016 6:49 PM Performed by: Little IshikawaMERCER, Yena Tisby L Pre-anesthesia Checklist: Patient identified, Emergency Drugs available, Suction available and Patient being monitored Patient Re-evaluated:Patient Re-evaluated prior to inductionOxygen Delivery Method: Circle System Utilized Preoxygenation: Pre-oxygenation with 100% oxygen Intubation Type: IV induction Ventilation: Mask ventilation without difficulty LMA: LMA inserted LMA Size: 5.0 Number of attempts: 1 Airway Equipment and Method: Bite block Placement Confirmation: positive ETCO2 Tube secured with: Tape Dental Injury: Teeth and Oropharynx as per pre-operative assessment

## 2016-09-22 NOTE — ED Notes (Signed)
Hand wrapped. Waiting for ortho to splint

## 2016-09-22 NOTE — Discharge Instructions (Signed)
KEEP BANDAGE CLEAN AND DRY °CALL OFFICE FOR F/U APPT [phone] IN 8 DAYS °KEEP HAND ELEVATED ABOVE HEART °OK TO APPLY ICE TO OPERATIVE AREA °CONTACT OFFICE IF ANY WORSENING PAIN OR CONCERNS. °

## 2016-09-22 NOTE — Anesthesia Postprocedure Evaluation (Signed)
Anesthesia Post Note  Patient: Clayton Burton  Procedure(s) Performed: Procedure(s) (LRB): OPEN REDUCTION INTERNAL FIXATION (ORIF) METACARPAL/WOUND CLOSURE (Left)  Patient location during evaluation: PACU Anesthesia Type: General Level of consciousness: awake, awake and alert and oriented Pain management: pain level controlled Vital Signs Assessment: post-procedure vital signs reviewed and stable Respiratory status: spontaneous breathing, nonlabored ventilation and respiratory function stable Cardiovascular status: blood pressure returned to baseline Anesthetic complications: no    Last Vitals:  Vitals:   09/22/16 2010  Temp: 36.9 C    Last Pain:  Vitals:   09/22/16 2010  PainSc: 10-Worst pain ever                 Tavionna Grout COKER
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
## 2016-09-23 ENCOUNTER — Encounter (HOSPITAL_COMMUNITY): Payer: Self-pay | Admitting: Orthopedic Surgery
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
## 2016-09-23 NOTE — Op Note (Deleted)
  The note originally documented on this encounter has been moved the the encounter in which it belongs.  

## 2016-10-20 ENCOUNTER — Ambulatory Visit (HOSPITAL_COMMUNITY)
Admission: EM | Admit: 2016-10-20 | Discharge: 2016-10-20 | Disposition: A | Discharge status: Home / Self Care | Payer: Self-pay | Attending: Internal Medicine | Admitting: Internal Medicine

## 2016-10-20 ENCOUNTER — Encounter (HOSPITAL_COMMUNITY): Payer: Self-pay | Admitting: Emergency Medicine

## 2016-10-20 DIAGNOSIS — R369 Urethral discharge, unspecified: Secondary | ICD-10-CM

## 2016-10-20 DIAGNOSIS — Z202 Contact with and (suspected) exposure to infections with a predominantly sexual mode of transmission: Secondary | ICD-10-CM

## 2016-10-20 MED ORDER — CEFTRIAXONE SODIUM 250 MG IJ SOLR
250.0000 mg | Freq: Once | INTRAMUSCULAR | Status: AC
  Administered 2016-10-20: 250 mg via INTRAMUSCULAR

## 2016-10-20 MED ORDER — LIDOCAINE HCL (PF) 1 % IJ SOLN
INTRAMUSCULAR | Status: AC
  Filled 2016-10-20: qty 2

## 2016-10-20 MED ORDER — AZITHROMYCIN 250 MG PO TABS
ORAL_TABLET | ORAL | Status: AC
  Filled 2016-10-20: qty 4

## 2016-10-20 MED ORDER — CEFTRIAXONE SODIUM 250 MG IJ SOLR
INTRAMUSCULAR | Status: AC
  Filled 2016-10-20: qty 250

## 2016-10-20 MED ORDER — AZITHROMYCIN 250 MG PO TABS
1000.0000 mg | ORAL_TABLET | Freq: Once | ORAL | Status: AC
  Administered 2016-10-20: 1000 mg via ORAL
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
## 2016-10-20 NOTE — ED Notes (Signed)
Dirty and clean urine collected. 

## 2016-10-20 NOTE — Discharge Instructions (Signed)
Sexually Transmitted Disease A sexually transmitted disease (STD) is a disease or infection that may be passed (transmitted) from person to person, usually during sexual activity. This may happen by way of saliva, semen, blood, vaginal mucus, or urine. Common STDs include:  Gonorrhea.  Chlamydia.  Syphilis.  HIV and AIDS.  Genital herpes.  Hepatitis B and C.  Trichomonas.  Human papillomavirus (HPV).  Pubic lice.  Scabies.  Mites.  Bacterial vaginosis. What are the causes? An STD may be caused by bacteria, a virus, or parasites. STDs are often transmitted during sexual activity if one person is infected. However, they may also be transmitted through nonsexual means. STDs may be transmitted after:  Sexual intercourse with an infected person.  Sharing sex toys with an infected person.  Sharing needles with an infected person or using unclean piercing or tattoo needles.  Having intimate contact with the genitals, mouth, or rectal areas of an infected person.  Exposure to infected fluids during birth. What are the signs or symptoms? Different STDs have different symptoms. Some people may not have any symptoms. If symptoms are present, they may include:  Painful or bloody urination.  Pain in the pelvis, abdomen, vagina, anus, throat, or eyes.  A skin rash, itching, or irritation.  Growths, ulcerations, blisters, or sores in the genital and anal areas.  Abnormal vaginal discharge with or without bad odor.  Penile discharge in men.  Fever.  Pain or bleeding during sexual intercourse.  Swollen glands in the groin area.  Yellow skin and eyes (jaundice). This is seen with hepatitis.  Swollen testicles.  Infertility.  Sores and blisters in the mouth. How is this diagnosed? To make a diagnosis, your health care provider may:  Take a medical history.  Perform a physical exam.  Take a sample of any discharge to examine.  Swab the throat, cervix, opening to  the penis, rectum, or vagina for testing.  Test a sample of your first morning urine.  Perform blood tests.  Perform a Pap test, if this applies.  Perform a colposcopy.  Perform a laparoscopy. How is this treated? Treatment depends on the STD. Some STDs may be treated but not cured.  Chlamydia, gonorrhea, trichomonas, and syphilis can be cured with antibiotic medicine.  Genital herpes, hepatitis, and HIV can be treated, but not cured, with prescribed medicines. The medicines lessen symptoms.  Genital warts from HPV can be treated with medicine or by freezing, burning (electrocautery), or surgery. Warts may come back.  HPV cannot be cured with medicine or surgery. However, abnormal areas may be removed from the cervix, vagina, or vulva.  If your diagnosis is confirmed, your recent sexual partners need treatment. This is true even if they are symptom-free or have a negative culture or evaluation. They should not have sex until their health care providers say it is okay.  Your health care provider may test you for infection again 3 months after treatment. How is this prevented? Take these steps to reduce your risk of getting an STD:  Use latex condoms, dental dams, and water-soluble lubricants during sexual activity. Do not use petroleum jelly or oils.  Avoid having multiple sex partners.  Do not have sex with someone who has other sex partners.  Do not have sex with anyone you do not know or who is at high risk for an STD.  Avoid risky sex practices that can break your skin.  Do not have sex if you have open sores on your mouth or skin.    Avoid drinking too much alcohol or taking illegal drugs. Alcohol and drugs can affect your judgment and put you in a vulnerable position.  Avoid engaging in oral and anal sex acts.  Get vaccinated for HPV and hepatitis. If you have not received these vaccines in the past, talk to your health care provider about whether one or both might be  right for you.  If you are at risk of being infected with HIV, it is recommended that you take a prescription medicine daily to prevent HIV infection. This is called pre-exposure prophylaxis (PrEP). You are considered at risk if:  You are a man who has sex with other men (MSM).  You are a heterosexual man or woman and are sexually active with more than one partner.  You take drugs by injection.  You are sexually active with a partner who has HIV.  Talk with your health care provider about whether you are at high risk of being infected with HIV. If you choose to begin PrEP, you should first be tested for HIV. You should then be tested every 3 months for as long as you are taking PrEP. Contact a health care provider if:  See your health care provider.  Tell your sexual partner(s). They should be tested and treated for any STDs.  Do not have sex until your health care provider says it is okay. Get help right away if: Contact your health care provider right away if:  You have severe abdominal pain.  You are a man and notice swelling or pain in your testicles.  You are a woman and notice swelling or pain in your vagina. This information is not intended to replace advice given to you by your health care provider. Make sure you discuss any questions you have with your health care provider. Document Released: 12/18/2002 Document Revised: 04/16/2016 Document Reviewed: 04/17/2013 Elsevier Interactive Patient Education  2017 [employer]  

## 2016-11-03 ENCOUNTER — Ambulatory Visit (HOSPITAL_COMMUNITY)
Admission: EM | Admit: 2016-11-03 | Discharge: 2016-11-03 | Disposition: A | Discharge status: Home / Self Care | Payer: Self-pay | Attending: Emergency Medicine | Admitting: Emergency Medicine

## 2016-11-03 ENCOUNTER — Encounter (HOSPITAL_COMMUNITY): Payer: Self-pay | Admitting: Emergency Medicine

## 2016-11-03 DIAGNOSIS — F17210 Nicotine dependence, cigarettes, uncomplicated: Secondary | ICD-10-CM | POA: Insufficient documentation

## 2016-11-03 DIAGNOSIS — Z888 Allergy status to other drugs, medicaments and biological substances status: Secondary | ICD-10-CM | POA: Insufficient documentation

## 2016-11-03 DIAGNOSIS — D573 Sickle-cell trait: Secondary | ICD-10-CM | POA: Insufficient documentation

## 2016-11-03 DIAGNOSIS — Z202 Contact with and (suspected) exposure to infections with a predominantly sexual mode of transmission: Secondary | ICD-10-CM | POA: Insufficient documentation

## 2016-11-03 DIAGNOSIS — Z885 Allergy status to narcotic agent status: Secondary | ICD-10-CM | POA: Insufficient documentation

## 2016-11-03 DIAGNOSIS — R369 Urethral discharge, unspecified: Secondary | ICD-10-CM | POA: Insufficient documentation

## 2016-11-03 DIAGNOSIS — Z9889 Other specified postprocedural states: Secondary | ICD-10-CM | POA: Insufficient documentation

## 2016-11-03 MED ORDER — CEFTRIAXONE SODIUM 250 MG IJ SOLR
INTRAMUSCULAR | Status: AC
  Filled 2016-11-03: qty 250

## 2016-11-03 MED ORDER — AZITHROMYCIN 250 MG PO TABS
1000.0000 mg | ORAL_TABLET | Freq: Once | ORAL | Status: AC
  Administered 2016-11-03: 1000 mg via ORAL

## 2016-11-03 MED ORDER — AZITHROMYCIN 250 MG PO TABS
ORAL_TABLET | ORAL | Status: AC
  Filled 2016-11-03: qty 4

## 2016-11-03 MED ORDER — CEFTRIAXONE SODIUM 250 MG IJ SOLR
250.0000 mg | Freq: Once | INTRAMUSCULAR | Status: AC
  Administered 2016-11-03: 250 mg via INTRAMUSCULAR

## 2016-11-03 NOTE — ED Triage Notes (Signed)
[facility] a [facility] of a penile discharge and possible exposure to an STD.
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
## 2016-11-03 NOTE — Discharge Instructions (Signed)
You are being treated with two antibiotics. Your sexual partner must also be treated at the same time. The health department treats STDs on a regular basis, you may call them to get an appointment.

## 2016-11-03 NOTE — ED Notes (Signed)
Dirty and clean urine collected. 

## 2016-11-04 LAB — URINE CYTOLOGY ANCILLARY ONLY
Chlamydia: NEGATIVE
NEISSERIA GONORRHEA: NEGATIVE
TRICH (WINDOWPATH): NEGATIVE

## 2018-05-08 ENCOUNTER — Ambulatory Visit (HOSPITAL_COMMUNITY)
Admission: EM | Admit: 2018-05-08 | Discharge: 2018-05-08 | Disposition: A | Discharge status: Home / Self Care | Payer: Self-pay | Attending: Family Medicine | Admitting: Family Medicine

## 2018-05-08 ENCOUNTER — Encounter (HOSPITAL_COMMUNITY): Payer: Self-pay | Admitting: Emergency Medicine

## 2018-05-08 DIAGNOSIS — R197 Diarrhea, unspecified: Secondary | ICD-10-CM

## 2018-05-08 MED ORDER — LOPERAMIDE HCL 2 MG PO TABS: 2.0000 mg | ORAL_TABLET | Freq: Four times a day (QID) | ORAL | 0 refills | Status: DC | PRN

## 2018-05-08 NOTE — Discharge Instructions (Signed)
Drink plenty of fluids Bland diet Advance as tolerated Take imodium as needed for loose bowels Expect improvement in a couple of days

## 2018-05-08 NOTE — ED Triage Notes (Signed)
PT reports diarrhea since Friday and some lightheadedness with position changes. PT reports 3 episodes diarrhea in last 24 hours. Child  Is sick at home. PT is drinking fluids.
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
## 2018-05-18 ENCOUNTER — Emergency Department (HOSPITAL_COMMUNITY): Payer: Self-pay

## 2018-05-18 ENCOUNTER — Emergency Department (HOSPITAL_COMMUNITY)
Admission: EM | Admit: 2018-05-18 | Discharge: 2018-05-18 | Disposition: A | Discharge status: Home / Self Care | Payer: Self-pay | Attending: Emergency Medicine | Admitting: Emergency Medicine

## 2018-05-18 ENCOUNTER — Encounter (HOSPITAL_COMMUNITY): Payer: Self-pay | Admitting: *Deleted

## 2018-05-18 DIAGNOSIS — F17210 Nicotine dependence, cigarettes, uncomplicated: Secondary | ICD-10-CM | POA: Insufficient documentation

## 2018-05-18 DIAGNOSIS — M79642 Pain in left hand: Secondary | ICD-10-CM | POA: Insufficient documentation

## 2018-05-18 DIAGNOSIS — M25562 Pain in left knee: Secondary | ICD-10-CM | POA: Insufficient documentation

## 2018-05-18 DIAGNOSIS — Z89421 Acquired absence of other right toe(s): Secondary | ICD-10-CM | POA: Insufficient documentation

## 2018-05-18 MED ORDER — IBUPROFEN 800 MG PO TABS
800.0000 mg | ORAL_TABLET | Freq: Once | ORAL | Status: AC
  Administered 2018-05-18: 800 mg via ORAL
  Filled 2018-05-18: qty 1
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
## 2018-05-18 NOTE — Discharge Instructions (Addendum)
X-rays look good today! There are no broken bones or dislocations.  You may use Tylenol and/or Ibuprofen (or Naproxen). for pain relief and swelling. You may also use cold compresses in 15-20 min interval for additional relief.   Wear the knee brace when you are going to be walking a lot or standing for extended periods of time. Take it off for bathing, bed and when you will be at rest for long periods of time. You may follow-up with your PCP if you continue to have issues for more than 4-6 weeks.

## 2018-05-18 NOTE — ED Notes (Signed)
Ortho to come apply knee immobilizer

## 2018-05-18 NOTE — ED Notes (Signed)
APP at bedside 
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
## 2018-05-18 NOTE — ED Notes (Signed)
Patient able to ambulate independently  

## 2018-05-18 NOTE — Progress Notes (Signed)
Orthopedic Tech Progress Note Patient Details:  Clayton Burton 1981/02/28 [id number]  Ortho Devices Type of Ortho Device: Knee Immobilizer Ortho Device/Splint Location: lle Ortho Device/Splint Interventions: Application   Post Interventions Patient Tolerated: Well Instructions Provided: Care of device   [name], [name] 05/18/2018, 3:06 PM

## 2018-06-25 ENCOUNTER — Encounter (HOSPITAL_COMMUNITY): Payer: Self-pay | Admitting: Emergency Medicine

## 2018-06-25 ENCOUNTER — Emergency Department (HOSPITAL_COMMUNITY)
Admission: EM | Admit: 2018-06-25 | Discharge: 2018-06-25 | Disposition: A | Discharge status: Home / Self Care | Payer: Self-pay | Attending: Emergency Medicine | Admitting: Emergency Medicine

## 2018-06-25 ENCOUNTER — Other Ambulatory Visit: Payer: Self-pay

## 2018-06-25 DIAGNOSIS — F17210 Nicotine dependence, cigarettes, uncomplicated: Secondary | ICD-10-CM | POA: Insufficient documentation

## 2018-06-25 DIAGNOSIS — M25462 Effusion, left knee: Secondary | ICD-10-CM | POA: Insufficient documentation

## 2018-06-25 DIAGNOSIS — M25562 Pain in left knee: Secondary | ICD-10-CM | POA: Insufficient documentation

## 2018-06-25 DIAGNOSIS — D573 Sickle-cell trait: Secondary | ICD-10-CM | POA: Insufficient documentation

## 2018-06-25 MED ORDER — MELOXICAM 15 MG PO TABS: 15.0000 mg | ORAL_TABLET | Freq: Every day | ORAL | 0 refills | Status: DC

## 2018-06-25 NOTE — Discharge Instructions (Signed)
Contact a health care provider if: °You have ongoing (persistent) pain in your knee. °Get help right away if: °You have increased swelling or redness of your knee. °You have severe pain in your knee. °You have a fever. °
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
## 2018-06-26 ENCOUNTER — Telehealth: Payer: Self-pay

## 2018-06-26 NOTE — Telephone Encounter (Signed)
Message received from [name], RN requesting a [facility]. Attempted to contact the patient as there is an appointment available 07/05/18.  Call placed to # [phone] and a message was left with Candace requesting that the patient return the call to this CM. Call placed to # [phone] and a voicemail message was left requesting a call back to this CM # [phone]. The appointment may/may not be available when /if he calls back. [facility].   Update provided to C. Bennett,RN

## 2018-07-02 ENCOUNTER — Emergency Department (HOSPITAL_COMMUNITY): Payer: Self-pay

## 2018-07-02 ENCOUNTER — Observation Stay (HOSPITAL_COMMUNITY)
Admission: EM | Admit: 2018-07-02 | Discharge: 2018-07-03 | Disposition: A | Discharge status: Home / Self Care | Payer: Self-pay | Attending: Surgery | Admitting: Surgery

## 2018-07-02 ENCOUNTER — Other Ambulatory Visit: Payer: Self-pay

## 2018-07-02 ENCOUNTER — Encounter (HOSPITAL_COMMUNITY): Payer: Self-pay | Admitting: *Deleted

## 2018-07-02 ENCOUNTER — Observation Stay (HOSPITAL_COMMUNITY): Payer: Self-pay

## 2018-07-02 DIAGNOSIS — M25562 Pain in left knee: Secondary | ICD-10-CM | POA: Insufficient documentation

## 2018-07-02 DIAGNOSIS — Z886 Allergy status to analgesic agent status: Secondary | ICD-10-CM | POA: Insufficient documentation

## 2018-07-02 DIAGNOSIS — S21112A Laceration without foreign body of left front wall of thorax without penetration into thoracic cavity, initial encounter: Secondary | ICD-10-CM

## 2018-07-02 DIAGNOSIS — S21312A Laceration without foreign body of left front wall of thorax with penetration into thoracic cavity, initial encounter: Principal | ICD-10-CM | POA: Insufficient documentation

## 2018-07-02 DIAGNOSIS — F17200 Nicotine dependence, unspecified, uncomplicated: Secondary | ICD-10-CM | POA: Insufficient documentation

## 2018-07-02 DIAGNOSIS — J942 Hemothorax: Secondary | ICD-10-CM

## 2018-07-02 DIAGNOSIS — F10920 Alcohol use, unspecified with intoxication, uncomplicated: Secondary | ICD-10-CM | POA: Insufficient documentation

## 2018-07-02 DIAGNOSIS — N289 Disorder of kidney and ureter, unspecified: Secondary | ICD-10-CM | POA: Insufficient documentation

## 2018-07-02 DIAGNOSIS — R0789 Other chest pain: Secondary | ICD-10-CM | POA: Insufficient documentation

## 2018-07-02 DIAGNOSIS — M7989 Other specified soft tissue disorders: Secondary | ICD-10-CM | POA: Insufficient documentation

## 2018-07-02 DIAGNOSIS — Z23 Encounter for immunization: Secondary | ICD-10-CM | POA: Insufficient documentation

## 2018-07-02 DIAGNOSIS — Y9229 Other specified public building as the place of occurrence of the external cause: Secondary | ICD-10-CM | POA: Insufficient documentation

## 2018-07-02 DIAGNOSIS — S271XXA Traumatic hemothorax, initial encounter: Secondary | ICD-10-CM | POA: Insufficient documentation

## 2018-07-02 DIAGNOSIS — R918 Other nonspecific abnormal finding of lung field: Secondary | ICD-10-CM | POA: Insufficient documentation

## 2018-07-02 DIAGNOSIS — S51811A Laceration without foreign body of right forearm, initial encounter: Secondary | ICD-10-CM | POA: Insufficient documentation

## 2018-07-02 LAB — COMPREHENSIVE METABOLIC PANEL
ALT: 25 U/L (ref 0–44)
AST: 49 U/L — ABNORMAL HIGH (ref 15–41)
Albumin: 3.5 g/dL (ref 3.5–5.0)
Alkaline Phosphatase: 54 U/L (ref 38–126)
Anion gap: 15 (ref 5–15)
BUN: 11 mg/dL (ref 6–20)
CHLORIDE: 103 mmol/L (ref 98–111)
CO2: 22 mmol/L (ref 22–32)
CREATININE: 1.38 mg/dL — AB (ref 0.61–1.24)
Calcium: 8.6 mg/dL — ABNORMAL LOW (ref 8.9–10.3)
Glucose, Bld: 96 mg/dL (ref 70–99)
POTASSIUM: 4 mmol/L (ref 3.5–5.1)
Sodium: 140 mmol/L (ref 135–145)
Total Bilirubin: 0.4 mg/dL (ref 0.3–1.2)
Total Protein: 6.4 g/dL — ABNORMAL LOW (ref 6.5–8.1)

## 2018-07-02 LAB — I-STAT CG4 LACTIC ACID, ED: Lactic Acid, Venous: 2.19 mmol/L (ref 0.5–1.9)

## 2018-07-02 LAB — ETHANOL: ALCOHOL ETHYL (B): 172 mg/dL — AB (ref <10)

## 2018-07-02 LAB — I-STAT CHEM 8, ED
BUN: 10 mg/dL (ref 6–20)
CREATININE: 1.6 mg/dL — AB (ref 0.61–1.24)
Calcium, Ion: 0.97 mmol/L — ABNORMAL LOW (ref 1.15–1.40)
Chloride: 104 mmol/L (ref 98–111)
GLUCOSE: 99 mg/dL (ref 70–99)
HCT: 49 % (ref 39.0–52.0)
HEMOGLOBIN: 16.7 g/dL (ref 13.0–17.0)
Potassium: 4 mmol/L (ref 3.5–5.1)
Sodium: 139 mmol/L (ref 135–145)
TCO2: 24 mmol/L (ref 22–32)

## 2018-07-02 LAB — URINALYSIS, ROUTINE W REFLEX MICROSCOPIC
Bilirubin Urine: NEGATIVE
GLUCOSE, UA: NEGATIVE mg/dL
HGB URINE DIPSTICK: NEGATIVE
Ketones, ur: NEGATIVE mg/dL
LEUKOCYTES UA: NEGATIVE
Nitrite: NEGATIVE
PH: 5 (ref 5.0–8.0)
PROTEIN: NEGATIVE mg/dL
Specific Gravity, Urine: 1.04 — ABNORMAL HIGH (ref 1.005–1.030)

## 2018-07-02 LAB — CBC WITH DIFFERENTIAL/PLATELET
ABS IMMATURE GRANULOCYTES: 0.1 10*3/uL (ref 0.0–0.1)
BASOS PCT: 0 %
Basophils Absolute: 0.1 10*3/uL (ref 0.0–0.1)
Eosinophils Absolute: 0.3 10*3/uL (ref 0.0–0.7)
Eosinophils Relative: 2 %
HEMATOCRIT: 48.7 % (ref 39.0–52.0)
HEMOGLOBIN: 16 g/dL (ref 13.0–17.0)
IMMATURE GRANULOCYTES: 0 %
LYMPHS ABS: 3.1 10*3/uL (ref 0.7–4.0)
LYMPHS PCT: 27 %
MCH: 29.5 pg (ref 26.0–34.0)
MCHC: 32.9 g/dL (ref 30.0–36.0)
MCV: 89.7 fL (ref 78.0–100.0)
MONOS PCT: 8 %
Monocytes Absolute: 0.9 10*3/uL (ref 0.1–1.0)
NEUTROS ABS: 7.2 10*3/uL (ref 1.7–7.7)
Neutrophils Relative %: 63 %
PLATELETS: 258 10*3/uL (ref 150–400)
RBC: 5.43 MIL/uL (ref 4.22–5.81)
RDW: 13.9 % (ref 11.5–15.5)
WBC: 11.6 10*3/uL — ABNORMAL HIGH (ref 4.0–10.5)

## 2018-07-02 LAB — ABO/RH: ABO/RH(D): O POS

## 2018-07-02 LAB — HIV ANTIBODY (ROUTINE TESTING W REFLEX): HIV Screen 4th Generation wRfx: NONREACTIVE

## 2018-07-02 LAB — PROTIME-INR
INR: 0.87
PROTHROMBIN TIME: 11.8 s (ref 11.4–15.2)

## 2018-07-02 LAB — CDS SEROLOGY

## 2018-07-02 MED ORDER — SODIUM CHLORIDE 0.9% FLUSH
3.0000 mL | Freq: Two times a day (BID) | INTRAVENOUS | Status: DC
  Administered 2018-07-02 (×2): 3 mL via INTRAVENOUS

## 2018-07-02 MED ORDER — MORPHINE SULFATE (PF) 2 MG/ML IV SOLN: 2.0000 mg | INTRAVENOUS | Status: DC | PRN

## 2018-07-02 MED ORDER — ONDANSETRON HCL 4 MG/2ML IJ SOLN: 4.0000 mg | Freq: Four times a day (QID) | INTRAMUSCULAR | Status: DC | PRN

## 2018-07-02 MED ORDER — ONDANSETRON 4 MG PO TBDP: 4.0000 mg | ORAL_TABLET | Freq: Four times a day (QID) | ORAL | Status: DC | PRN

## 2018-07-02 MED ORDER — MORPHINE SULFATE (PF) 4 MG/ML IV SOLN
INTRAVENOUS | Status: AC
  Administered 2018-07-02: 4 mg
  Filled 2018-07-02: qty 1

## 2018-07-02 MED ORDER — SODIUM CHLORIDE 0.9% FLUSH: 3.0000 mL | INTRAVENOUS | Status: DC | PRN

## 2018-07-02 MED ORDER — IOHEXOL 300 MG/ML  SOLN
100.0000 mL | Freq: Once | INTRAMUSCULAR | Status: AC | PRN
  Administered 2018-07-02: 100 mL via INTRAVENOUS

## 2018-07-02 MED ORDER — ACETAMINOPHEN 325 MG PO TABS: 650.0000 mg | ORAL_TABLET | ORAL | Status: DC | PRN

## 2018-07-02 MED ORDER — OXYCODONE HCL 5 MG PO TABS
5.0000 mg | ORAL_TABLET | ORAL | Status: DC | PRN
  Administered 2018-07-02 – 2018-07-03 (×4): 5 mg via ORAL
  Filled 2018-07-02 (×4): qty 1

## 2018-07-02 MED ORDER — SODIUM CHLORIDE 0.9 % IV SOLN: 250.0000 mL | INTRAVENOUS | Status: DC | PRN

## 2018-07-02 MED ORDER — TETANUS-DIPHTH-ACELL PERTUSSIS 5-2.5-18.5 LF-MCG/0.5 IM SUSP
0.5000 mL | Freq: Once | INTRAMUSCULAR | Status: AC
  Administered 2018-07-02: 0.5 mL via INTRAMUSCULAR
  Filled 2018-07-02: qty 0.5
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
## 2018-07-02 NOTE — ED Notes (Signed)
Pt sleeping at present

## 2018-07-02 NOTE — ED Notes (Signed)
Pt awake now and is saying that he has some difficulty breathing.  02 mask he keeps on his chin sats 100%
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
## 2018-07-02 NOTE — ED Notes (Signed)
Portable chest and abd

## 2018-07-02 NOTE — ED Notes (Signed)
Morphine given

## 2018-07-02 NOTE — ED Notes (Signed)
pts shoes socks shirt and jeans with a belt and drivers license placed in a belongings bag
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
## 2018-07-02 NOTE — ED Notes (Signed)
Report attempted unable to give rn there not answering
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
## 2018-07-02 NOTE — ED Notes (Signed)
pts girlfriend at the bedside

## 2018-07-02 NOTE — ED Notes (Signed)
To ct

## 2018-07-02 NOTE — ED Notes (Signed)
bp 130/80 p 77

## 2018-07-03 ENCOUNTER — Other Ambulatory Visit: Payer: Self-pay

## 2018-07-03 ENCOUNTER — Encounter (HOSPITAL_COMMUNITY): Payer: Self-pay | Admitting: *Deleted

## 2018-07-03 LAB — TYPE AND SCREEN
ABO/RH(D): O POS
Antibody Screen: NEGATIVE
UNIT DIVISION: 0
UNIT DIVISION: 0

## 2018-07-03 LAB — BPAM RBC
BLOOD PRODUCT EXPIRATION DATE: 201910202359
Blood Product Expiration Date: 201910192359
ISSUE DATE / TIME: 201909230040
ISSUE DATE / TIME: 201909220323
UNIT TYPE AND RH: 9500
Unit Type and Rh: 9500

## 2018-07-03 MED ORDER — ACETAMINOPHEN 500 MG PO TABS: 1000.0000 mg | ORAL_TABLET | Freq: Four times a day (QID) | ORAL | 0 refills | Status: AC | PRN

## 2018-07-03 MED ORDER — ACETAMINOPHEN 500 MG PO TABS
1000.0000 mg | ORAL_TABLET | Freq: Four times a day (QID) | ORAL | Status: DC
  Administered 2018-07-03: 1000 mg via ORAL
  Filled 2018-07-03: qty 2

## 2018-07-03 MED ORDER — OXYCODONE HCL 5 MG PO TABS: 2.5000 mg | ORAL_TABLET | ORAL | 0 refills | Status: DC | PRN

## 2018-07-03 NOTE — Progress Notes (Signed)
Discharge home. Home discharge instruction given, no question verbalized. 
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
## 2018-07-06 ENCOUNTER — Encounter (HOSPITAL_COMMUNITY): Payer: Self-pay | Admitting: Emergency Medicine

## 2018-10-07 ENCOUNTER — Emergency Department (HOSPITAL_COMMUNITY)
Admission: EM | Admit: 2018-10-07 | Discharge: 2018-10-07 | Disposition: A | Discharge status: Home / Self Care | Payer: Self-pay | Attending: Emergency Medicine | Admitting: Emergency Medicine

## 2018-10-07 ENCOUNTER — Other Ambulatory Visit: Payer: Self-pay

## 2018-10-07 ENCOUNTER — Encounter (HOSPITAL_COMMUNITY): Payer: Self-pay

## 2018-10-07 DIAGNOSIS — F17210 Nicotine dependence, cigarettes, uncomplicated: Secondary | ICD-10-CM | POA: Insufficient documentation

## 2018-10-07 DIAGNOSIS — Z79899 Other long term (current) drug therapy: Secondary | ICD-10-CM | POA: Insufficient documentation

## 2018-10-07 DIAGNOSIS — R197 Diarrhea, unspecified: Secondary | ICD-10-CM | POA: Insufficient documentation

## 2018-10-07 DIAGNOSIS — R112 Nausea with vomiting, unspecified: Secondary | ICD-10-CM | POA: Insufficient documentation

## 2018-10-07 LAB — CBC
HEMATOCRIT: 44.7 % (ref 39.0–52.0)
HEMOGLOBIN: 14.5 g/dL (ref 13.0–17.0)
MCH: 28.5 pg (ref 26.0–34.0)
MCHC: 32.4 g/dL (ref 30.0–36.0)
MCV: 88 fL (ref 80.0–100.0)
NRBC: 0 % (ref 0.0–0.2)
Platelets: 249 10*3/uL (ref 150–400)
RBC: 5.08 MIL/uL (ref 4.22–5.81)
RDW: 14.6 % (ref 11.5–15.5)
WBC: 10.1 10*3/uL (ref 4.0–10.5)

## 2018-10-07 LAB — URINALYSIS, ROUTINE W REFLEX MICROSCOPIC
BILIRUBIN URINE: NEGATIVE
Glucose, UA: NEGATIVE mg/dL
Hgb urine dipstick: NEGATIVE
KETONES UR: 5 mg/dL — AB
LEUKOCYTES UA: NEGATIVE
NITRITE: NEGATIVE
PH: 5 (ref 5.0–8.0)
Protein, ur: NEGATIVE mg/dL
Specific Gravity, Urine: 1.015 (ref 1.005–1.030)

## 2018-10-07 LAB — COMPREHENSIVE METABOLIC PANEL
ALBUMIN: 3.6 g/dL (ref 3.5–5.0)
ALT: 24 U/L (ref 0–44)
AST: 35 U/L (ref 15–41)
Alkaline Phosphatase: 52 U/L (ref 38–126)
Anion gap: 10 (ref 5–15)
BILIRUBIN TOTAL: 0.6 mg/dL (ref 0.3–1.2)
BUN: 12 mg/dL (ref 6–20)
CHLORIDE: 104 mmol/L (ref 98–111)
CO2: 26 mmol/L (ref 22–32)
Calcium: 9.2 mg/dL (ref 8.9–10.3)
Creatinine, Ser: 1.5 mg/dL — ABNORMAL HIGH (ref 0.61–1.24)
GFR calc non Af Amer: 58 mL/min — ABNORMAL LOW (ref >60)
Glucose, Bld: 168 mg/dL — ABNORMAL HIGH (ref 70–99)
POTASSIUM: 4.4 mmol/L (ref 3.5–5.1)
SODIUM: 140 mmol/L (ref 135–145)
TOTAL PROTEIN: 6.3 g/dL — AB (ref 6.5–8.1)

## 2018-10-07 LAB — CBG MONITORING, ED
GLUCOSE-CAPILLARY: 65 mg/dL — AB (ref 70–99)
GLUCOSE-CAPILLARY: 102 mg/dL — AB (ref 70–99)
GLUCOSE-CAPILLARY: 68 mg/dL — AB (ref 70–99)

## 2018-10-07 LAB — LIPASE, BLOOD: Lipase: 33 U/L (ref 11–51)

## 2018-10-07 MED ORDER — SODIUM CHLORIDE 0.9 % IV BOLUS
1000.0000 mL | Freq: Once | INTRAVENOUS | Status: AC
  Administered 2018-10-07: 1000 mL via INTRAVENOUS

## 2018-10-07 MED ORDER — ONDANSETRON HCL 4 MG/2ML IJ SOLN
4.0000 mg | Freq: Once | INTRAMUSCULAR | Status: AC
  Administered 2018-10-07: 4 mg via INTRAVENOUS
  Filled 2018-10-07: qty 2

## 2018-10-07 MED ORDER — PANTOPRAZOLE SODIUM 20 MG PO TBEC: 20.0000 mg | DELAYED_RELEASE_TABLET | Freq: Every day | ORAL | 0 refills | Status: AC

## 2018-10-07 MED ORDER — PANTOPRAZOLE SODIUM 40 MG IV SOLR
40.0000 mg | Freq: Once | INTRAVENOUS | Status: AC
  Administered 2018-10-07: 40 mg via INTRAVENOUS
  Filled 2018-10-07: qty 40

## 2018-10-07 MED ORDER — ONDANSETRON HCL 4 MG PO TABS: 4.0000 mg | ORAL_TABLET | Freq: Four times a day (QID) | ORAL | 0 refills | Status: DC

## 2018-10-07 NOTE — ED Notes (Signed)
Pt verbalized understanding of discharge paperwork, prescriptions and follow-up care 

## 2018-10-07 NOTE — ED Notes (Signed)
Walked patient to the bathroom patient did well 

## 2018-10-07 NOTE — ED Triage Notes (Signed)
Pt endores n/v/d since yesterday. Afebrile, VSS.
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
## 2018-10-07 NOTE — ED Triage Notes (Signed)
Pt drinking a soda ,Plan to check CBG after IV bolus.

## 2018-10-07 NOTE — Discharge Instructions (Addendum)
Thank you for allowing me to care for you today in the Emergency Department.   Take 1 tablet of Zofran every 6 hours as needed for nausea or vomiting.  Continue to drink plenty of fluids to avoid dehydration while you are having diarrhea.  Most viral illnesses will significantly improve in 72 hours.  Take 1 tablet of Protonix daily for the next 2 weeks to help with the pain in your upper abdomen.  You can also try taking 650 mg of Tylenol or 600 mg of ibuprofen with food for body aches, abdominal pain, or headache.  You may return to work when you have been free from diarrhea or fever for more than 24 hours.  Return to the emergency department if you develop black or bloody stools, black or bloody vomiting, persistent vomiting despite taking Zofran, severe, uncontrollable pain, or other new, concerning symptoms.
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
## 2018-12-29 ENCOUNTER — Ambulatory Visit (HOSPITAL_COMMUNITY)
Admission: EM | Admit: 2018-12-29 | Discharge: 2018-12-29 | Disposition: A | Discharge status: Home / Self Care | Payer: Self-pay | Attending: Family Medicine | Admitting: Family Medicine

## 2018-12-29 ENCOUNTER — Encounter (HOSPITAL_COMMUNITY): Payer: Self-pay | Admitting: Emergency Medicine

## 2018-12-29 ENCOUNTER — Other Ambulatory Visit: Payer: Self-pay

## 2018-12-29 DIAGNOSIS — J069 Acute upper respiratory infection, unspecified: Secondary | ICD-10-CM

## 2018-12-29 MED ORDER — CETIRIZINE HCL 10 MG PO TABS: 10.0000 mg | ORAL_TABLET | Freq: Every day | ORAL | 0 refills | Status: AC

## 2018-12-29 MED ORDER — FLUTICASONE PROPIONATE 50 MCG/ACT NA SUSP: 1.0000 | Freq: Every day | NASAL | 2 refills | Status: AC

## 2018-12-29 NOTE — ED Notes (Signed)
Patient verbalizes understanding of discharge instructions. Opportunity for questioning and answers were provided. Patient discharged from [facility] by RN.  

## 2018-12-29 NOTE — ED Triage Notes (Signed)
Per pt he has been having cough nasal drainage down the back of his throat for about 3 days now. No fevers, chills, no body aches

## 2018-12-29 NOTE — Discharge Instructions (Signed)
Push fluids to ensure adequate hydration and keep secretions thin.  Tylenol and/or ibuprofen as needed for pain or fevers.  Daily zyrtec.  Nasal spray daily.  If symptoms worsen or do not improve in the next week to return to be seen or to follow up with PCP.
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
## 2019-09-18 ENCOUNTER — Encounter (HOSPITAL_COMMUNITY): Payer: Self-pay | Admitting: Emergency Medicine

## 2019-09-18 ENCOUNTER — Ambulatory Visit (HOSPITAL_COMMUNITY)
Admission: EM | Admit: 2019-09-18 | Discharge: 2019-09-18 | Disposition: A | Discharge status: Home / Self Care | Payer: Self-pay | Attending: Emergency Medicine | Admitting: Emergency Medicine

## 2019-09-18 ENCOUNTER — Other Ambulatory Visit: Payer: Self-pay

## 2019-09-18 ENCOUNTER — Ambulatory Visit (INDEPENDENT_AMBULATORY_CARE_PROVIDER_SITE_OTHER): Payer: Self-pay

## 2019-09-18 DIAGNOSIS — B353 Tinea pedis: Secondary | ICD-10-CM

## 2019-09-18 DIAGNOSIS — L03032 Cellulitis of left toe: Secondary | ICD-10-CM

## 2019-09-18 MED ORDER — DOXYCYCLINE HYCLATE 100 MG PO CAPS: 100.0000 mg | ORAL_CAPSULE | Freq: Two times a day (BID) | ORAL | 0 refills | Status: AC

## 2019-09-18 MED ORDER — TERBINAFINE HCL 250 MG PO TABS: 250.0000 mg | ORAL_TABLET | Freq: Every day | ORAL | 0 refills | Status: AC

## 2019-09-18 NOTE — Discharge Instructions (Addendum)
Continue miconazole.  Finish the doxycycline and terbinafine.  Your x-rays were normal.  [facility] with a primary care provider of your choice  Below is a list of primary care practices who are taking new patients for you to follow-up with.  [facility] at [facility] [address] [place], [place] [address] [phone]  [place], [place] [address] [phone]  [facility] [phone] [place] [address]  [facility]: [place]  [phone]  [facility]: [place]   [phone]  [facility]: [address] [place]  [phone]  [facility] primary [facility] : [address] [phone]  [facility] Primary [facility]: [address] [place] [address] [phone]  [facility]: [place] [phone]  Dr. Blanchie Serve [place]  [phone]  Dr. Benito Mccreedy, [facility]. [place], [place] [address]  [phone]  Go to www.goodrx.com to look up your medications. This will give you a list of where you can find your prescriptions at the most affordable prices. Or ask the pharmacist what the cash price is, or if they have any other discount programs available to help make your medication more affordable. This can be less expensive than what you would pay with insurance.

## 2019-09-18 NOTE — ED Triage Notes (Signed)
Patient reports pain in left foot with no injury.  Patient was initially seen in [place], treated with antibiotics.  Patient reports no improvement.    Patient has redness and swelling of left little toe.
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
## 2020-04-03 ENCOUNTER — Ambulatory Visit (HOSPITAL_COMMUNITY)
Admission: EM | Admit: 2020-04-03 | Discharge: 2020-04-03 | Disposition: A | Discharge status: Home / Self Care | Payer: Self-pay | Attending: Family Medicine | Admitting: Family Medicine

## 2020-04-03 ENCOUNTER — Encounter (HOSPITAL_COMMUNITY): Payer: Self-pay

## 2020-04-03 DIAGNOSIS — B353 Tinea pedis: Secondary | ICD-10-CM

## 2020-04-03 DIAGNOSIS — M79675 Pain in left toe(s): Secondary | ICD-10-CM

## 2020-04-03 LAB — CBG MONITORING, ED: Glucose-Capillary: 108 mg/dL — ABNORMAL HIGH (ref 70–99)
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
## 2020-04-03 NOTE — Discharge Instructions (Signed)
Begin soaking your foot in over the counter Domeboro solution as directed. Use over the counter clotrimazole cream twice daily. Keep your foot as dry as possible.

## 2020-04-03 NOTE — ED Triage Notes (Signed)
Pt presents to UC with pain, burning sensation in the fifth toe x 1 month.

## 2020-06-20 ENCOUNTER — Encounter (HOSPITAL_COMMUNITY): Payer: Self-pay | Admitting: *Deleted

## 2020-06-20 ENCOUNTER — Emergency Department (HOSPITAL_COMMUNITY)
Admission: EM | Admit: 2020-06-20 | Discharge: 2020-06-20 | Disposition: A | Discharge status: Home / Self Care | Payer: Self-pay | Attending: Emergency Medicine | Admitting: Emergency Medicine

## 2020-06-20 DIAGNOSIS — R519 Headache, unspecified: Secondary | ICD-10-CM | POA: Insufficient documentation

## 2020-06-20 DIAGNOSIS — Z5321 Procedure and treatment not carried out due to patient leaving prior to being seen by health care provider: Secondary | ICD-10-CM | POA: Insufficient documentation

## 2020-06-20 LAB — COMPREHENSIVE METABOLIC PANEL
ALT: 31 U/L (ref 0–44)
AST: 43 U/L — ABNORMAL HIGH (ref 15–41)
Albumin: 3.7 g/dL (ref 3.5–5.0)
Alkaline Phosphatase: 45 U/L (ref 38–126)
Anion gap: 11 (ref 5–15)
BUN: 10 mg/dL (ref 6–20)
CO2: 21 mmol/L — ABNORMAL LOW (ref 22–32)
Calcium: 9 mg/dL (ref 8.9–10.3)
Chloride: 107 mmol/L (ref 98–111)
Creatinine, Ser: 1.06 mg/dL (ref 0.61–1.24)
GFR calc Af Amer: >60 mL/min (ref >60)
GFR calc non Af Amer: >60 mL/min (ref >60)
Glucose, Bld: 95 mg/dL (ref 70–99)
Potassium: 4.3 mmol/L (ref 3.5–5.1)
Sodium: 139 mmol/L (ref 135–145)
Total Bilirubin: 0.9 mg/dL (ref 0.3–1.2)
Total Protein: 6.3 g/dL — ABNORMAL LOW (ref 6.5–8.1)

## 2020-06-20 LAB — CBC
HCT: 47.5 % (ref 39.0–52.0)
Hemoglobin: 15.5 g/dL (ref 13.0–17.0)
MCH: 29.6 pg (ref 26.0–34.0)
MCHC: 32.6 g/dL (ref 30.0–36.0)
MCV: 90.6 fL (ref 80.0–100.0)
Platelets: 238 10*3/uL (ref 150–400)
RBC: 5.24 MIL/uL (ref 4.22–5.81)
RDW: 13.8 % (ref 11.5–15.5)
WBC: 12.6 10*3/uL — ABNORMAL HIGH (ref 4.0–10.5)
nRBC: 0 % (ref 0.0–0.2)

## 2020-06-20 LAB — URINALYSIS, ROUTINE W REFLEX MICROSCOPIC
Bilirubin Urine: NEGATIVE
Glucose, UA: NEGATIVE mg/dL
Hgb urine dipstick: NEGATIVE
Ketones, ur: NEGATIVE mg/dL
Leukocytes,Ua: NEGATIVE
Nitrite: NEGATIVE
Protein, ur: NEGATIVE mg/dL
Specific Gravity, Urine: 1.019 (ref 1.005–1.030)
pH: 5 (ref 5.0–8.0)

## 2020-06-20 NOTE — ED Triage Notes (Signed)
To ED for eval after 'waking up here'. States he doesn't remember the past 2 days. Complains of HA and 'knot of head'. Pt is alert and oriented now. Pt states he is unsure how he got to the ED.

## 2020-06-20 NOTE — ED Notes (Signed)
Called patient several times patient didn't answer patient came up and said he was going to smoke patient never came back in

## 2020-09-06 ENCOUNTER — Emergency Department (HOSPITAL_COMMUNITY)
Admission: EM | Admit: 2020-09-06 | Discharge: 2020-09-06 | Disposition: A | Discharge status: Home / Self Care | Payer: HRSA Program | Attending: Emergency Medicine | Admitting: Emergency Medicine

## 2020-09-06 ENCOUNTER — Encounter (HOSPITAL_COMMUNITY): Payer: Self-pay | Admitting: Emergency Medicine

## 2020-09-06 ENCOUNTER — Other Ambulatory Visit: Payer: Self-pay

## 2020-09-06 ENCOUNTER — Emergency Department (HOSPITAL_COMMUNITY): Payer: HRSA Program

## 2020-09-06 DIAGNOSIS — U071 COVID-19: Secondary | ICD-10-CM | POA: Insufficient documentation

## 2020-09-06 DIAGNOSIS — F17210 Nicotine dependence, cigarettes, uncomplicated: Secondary | ICD-10-CM | POA: Diagnosis not present

## 2020-09-06 DIAGNOSIS — R059 Cough, unspecified: Secondary | ICD-10-CM | POA: Diagnosis present

## 2020-09-06 LAB — RESP PANEL BY RT-PCR (FLU A&B, COVID) ARPGX2
Influenza A by PCR: NEGATIVE
Influenza B by PCR: NEGATIVE
SARS Coronavirus 2 by RT PCR: POSITIVE — AB
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
## 2020-09-06 NOTE — ED Triage Notes (Signed)
Pt reports generalized weakness and non-productive cough since Wednesday.  He is here with his "baby's mama" that believes she has COVID as well.  + COVID exposure from her mother.  Denies pain.

## 2020-09-06 NOTE — Discharge Instructions (Addendum)
As we discussed today Tylenol is the preferred pain/fever medication to use with Covid. Please take Tylenol (acetaminophen) to relieve your pain.  You may take tylenol, up to 1,000 mg (two extra strength pills).  Do not take more than 3,000 mg tylenol in a 24 hour period.  Please check all medication labels as many medications such as pain and cold medications may contain tylenol. Please do not drink alcohol while taking this medication.   As we discussed you may receive a phone call from the monoclonal antibody infusion group to screen you to see if you qualify.  I would strongly recommend that you consider getting this treatment if it is offered to you.  Please get a pulse oximeter.  This is a finger oxygen sensor.  If you notice that you are regularly below 90%, or have any new or concerning symptoms including severe shortness of breath, severe chest pain, vomiting/diarrhea severe enough to cause dehydration, swelling of 1 or both legs or any other concerns please seek additional medical care and evaluation.  Please try to stop smoking.

## 2020-09-06 NOTE — ED Notes (Signed)
Pt discharged ambulatory. All questions and concerns addressed. No complaints at this time.   

## 2020-09-06 NOTE — ED Notes (Signed)
Pt reports lethargy and non productive cough. No other complaints at this time.

## 2020-09-07 ENCOUNTER — Other Ambulatory Visit (HOSPITAL_COMMUNITY): Payer: Self-pay | Admitting: Physician Assistant

## 2020-09-07 NOTE — Progress Notes (Signed)
I connected by phone with Clayton Burton on 09/07/2020 at 11:57 AM to discuss the potential use of a new treatment for mild to moderate COVID-19 viral infection in non-hospitalized patients.  This patient is a 39 y.o. male that meets the FDA criteria for Emergency Use Authorization of COVID monoclonal antibody casirivimab/imdevimab, bamlanivimab/eteseviamb, or sotrovimab.  Has a (+) direct SARS-CoV-2 viral test result  Has mild or moderate COVID-19   Is NOT hospitalized due to COVID-19  Is within 10 days of symptom onset  Has at least one of the high risk factor(s) for progression to severe COVID-19 and/or hospitalization as defined in EUA.  Specific high risk criteria : Sickle Cell Disease   I have spoken and communicated the following to the patient or parent/caregiver regarding COVID monoclonal antibody treatment:  1. FDA has authorized the emergency use for the treatment of mild to moderate COVID-19 in adults and pediatric patients with positive results of direct SARS-CoV-2 viral testing who are 32 years of age and older weighing at least 40 kg, and who are at high risk for progressing to severe COVID-19 and/or hospitalization.  2. The significant known and potential risks and benefits of COVID monoclonal antibody, and the extent to which such potential risks and benefits are unknown.  3. Information on available alternative treatments and the risks and benefits of those alternatives, including clinical trials.  4. Patients treated with COVID monoclonal antibody should continue to self-isolate and use infection control measures (e.g., wear mask, isolate, social distance, avoid sharing personal items, clean and disinfect "high touch" surfaces, and frequent handwashing) according to CDC guidelines.   5. The patient or parent/caregiver has the option to accept or refuse COVID monoclonal antibody treatment.  After reviewing this information with the patient, the patient has agreed to receive  one of the available covid 19 monoclonal antibodies and will be provided an appropriate fact sheet prior to infusion. Jodell Cipro, PA-C 09/07/2020 11:57 AM

## 2020-09-08 ENCOUNTER — Ambulatory Visit (HOSPITAL_COMMUNITY)
Admission: RE | Admit: 2020-09-08 | Discharge: 2020-09-08 | Disposition: A | Discharge status: Home / Self Care | Payer: HRSA Program | Source: Ambulatory Visit | Attending: Pulmonary Disease | Admitting: Pulmonary Disease

## 2020-09-08 DIAGNOSIS — U071 COVID-19: Secondary | ICD-10-CM | POA: Insufficient documentation

## 2020-09-08 MED ORDER — SODIUM CHLORIDE 0.9 % IV SOLN: INTRAVENOUS | Status: DC | PRN

## 2020-09-08 MED ORDER — SOTROVIMAB 500 MG/8ML IV SOLN
500.0000 mg | Freq: Once | INTRAVENOUS | Status: AC
  Administered 2020-09-08: 500 mg via INTRAVENOUS

## 2020-09-08 MED ORDER — ALBUTEROL SULFATE HFA 108 (90 BASE) MCG/ACT IN AERS: 2.0000 | INHALATION_SPRAY | Freq: Once | RESPIRATORY_TRACT | Status: DC | PRN

## 2020-09-08 MED ORDER — DIPHENHYDRAMINE HCL 50 MG/ML IJ SOLN: 50.0000 mg | Freq: Once | INTRAMUSCULAR | Status: DC | PRN

## 2020-09-08 MED ORDER — EPINEPHRINE 0.3 MG/0.3ML IJ SOAJ: 0.3000 mg | Freq: Once | INTRAMUSCULAR | Status: DC | PRN

## 2020-09-08 MED ORDER — FAMOTIDINE IN NACL 20-0.9 MG/50ML-% IV SOLN: 20.0000 mg | Freq: Once | INTRAVENOUS | Status: DC | PRN

## 2020-09-08 MED ORDER — METHYLPREDNISOLONE SODIUM SUCC 125 MG IJ SOLR: 125.0000 mg | Freq: Once | INTRAMUSCULAR | Status: DC | PRN

## 2020-09-08 NOTE — Progress Notes (Signed)
Patient reviewed Fact Sheet for Patients, Parents, and Caregivers for Emergency Use Authorization (EUA) of Sotrovimab for the Treatment of Coronavirus. Patient also reviewed and is agreeable to the estimated cost of treatment. Patient is agreeable to proceed.   

## 2020-09-08 NOTE — Progress Notes (Signed)
Diagnosis: COVID-19  Physician: Dr. Patrick Wright  Procedure: Covid Infusion Clinic Med: Sotrovimab infusion - Provided patient with sotrovimab fact sheet for patients, parents, and caregivers prior to infusion.   Complications: No immediate complications noted  Discharge: Discharged home    

## 2020-09-08 NOTE — Discharge Instructions (Signed)
10 Things You Can Do to Manage Your COVID-19 Symptoms at Home If you have possible or confirmed COVID-19: 1. Stay home from work and school. And stay away from other public places. If you must go out, avoid using any kind of public transportation, ridesharing, or taxis. 2. Monitor your symptoms carefully. If your symptoms get worse, call your healthcare provider immediately. 3. Get rest and stay hydrated. 4. If you have a medical appointment, call the healthcare provider ahead of time and tell them that you have or may have COVID-19. 5. For medical emergencies, call 911 and notify the dispatch personnel that you have or may have COVID-19. 6. Cover your cough and sneezes with a tissue or use the inside of your elbow. 7. Wash your hands often with soap and water for at least 20 seconds or clean your hands with an alcohol-based hand sanitizer that contains at least 60% alcohol. 8. As much as possible, stay in a specific room and away from other people in your home. Also, you should use a separate bathroom, if available. If you need to be around other people in or outside of the home, wear a mask. 9. Avoid sharing personal items with other people in your household, like dishes, towels, and bedding. 10. Clean all surfaces that are touched often, like counters, tabletops, and doorknobs. Use household cleaning sprays or wipes according to the label instructions. cdc.gov/coronavirus 04/11/2019 This information is not intended to replace advice given to you by your health care provider. Make sure you discuss any questions you have with your health care provider. Document Revised: 09/13/2019 Document Reviewed: 09/13/2019 Elsevier Patient Education  2020 [employer] Inc.  What types of side effects do monoclonal antibody drugs cause?  Common side effects  In general, the more common side effects caused by monoclonal antibody drugs include: . Allergic reactions, such as hives or itching . Flu-like signs and  symptoms, including chills, fatigue, fever, and muscle aches and pains . Nausea, vomiting . Diarrhea . Skin rashes . Low blood pressure   The CDC is recommending patients who receive monoclonal antibody treatments wait at least 90 days before being vaccinated.  Currently, there are no data on the safety and efficacy of mRNA COVID-19 vaccines in persons who received monoclonal antibodies or convalescent plasma as part of COVID-19 treatment. Based on the estimated half-life of such therapies as well as evidence suggesting that reinfection is uncommon in the 90 days after initial infection, vaccination should be deferred for at least 90 days, as a precautionary measure until additional information becomes available, to avoid interference of the antibody treatment with vaccine-induced immune responses.  If you have any questions or concerns after the infusion please call the Advanced Practice Provider on call at [phone]. This number is only intended for your use regarding questions or concerns about the infusion post-treatment side-effects.  Please do not provide this number to others for use.   If someone you know is interested in receiving treatment please have them call the COVID hotline at [phone].
